# Patient Record
Sex: Female | Born: 1975 | Race: Black or African American | Hispanic: No | Marital: Married | Smoking: Never smoker
Health system: Southern US, Community
[De-identification: ages and names within clinical notes are randomized; demographics above are authoritative.]

## PROBLEM LIST (undated history)

## (undated) DIAGNOSIS — K219 Gastro-esophageal reflux disease without esophagitis: Secondary | ICD-10-CM

## (undated) DIAGNOSIS — K589 Irritable bowel syndrome without diarrhea: Secondary | ICD-10-CM

## (undated) DIAGNOSIS — N3010 Interstitial cystitis (chronic) without hematuria: Secondary | ICD-10-CM

## (undated) HISTORY — PX: LIPOMA EXCISION: SHX5283

---

## 2001-10-17 ENCOUNTER — Emergency Department (HOSPITAL_COMMUNITY): Admission: EM | Admit: 2001-10-17 | Discharge: 2001-10-17 | Payer: Self-pay | Admitting: Emergency Medicine

## 2001-12-11 ENCOUNTER — Encounter: Payer: Self-pay | Admitting: Chiropractor

## 2001-12-11 ENCOUNTER — Ambulatory Visit (HOSPITAL_COMMUNITY): Admission: RE | Admit: 2001-12-11 | Discharge: 2001-12-11 | Payer: Self-pay | Admitting: Chiropractor

## 2001-12-31 ENCOUNTER — Encounter: Payer: Self-pay | Admitting: Chiropractor

## 2001-12-31 ENCOUNTER — Ambulatory Visit (HOSPITAL_COMMUNITY): Admission: RE | Admit: 2001-12-31 | Discharge: 2001-12-31 | Payer: Self-pay | Admitting: Chiropractor

## 2002-02-22 ENCOUNTER — Encounter: Payer: Self-pay | Admitting: Physical Medicine and Rehabilitation

## 2002-02-22 ENCOUNTER — Ambulatory Visit (HOSPITAL_COMMUNITY): Admission: RE | Admit: 2002-02-22 | Discharge: 2002-02-22 | Payer: Self-pay | Admitting: Family Medicine

## 2002-03-06 ENCOUNTER — Encounter
Admission: RE | Admit: 2002-03-06 | Discharge: 2002-04-17 | Payer: Self-pay | Admitting: Physical Medicine and Rehabilitation

## 2002-07-01 ENCOUNTER — Encounter
Admission: RE | Admit: 2002-07-01 | Discharge: 2002-09-18 | Payer: Self-pay | Admitting: Physical Medicine and Rehabilitation

## 2003-06-12 ENCOUNTER — Ambulatory Visit (HOSPITAL_COMMUNITY): Admission: RE | Admit: 2003-06-12 | Discharge: 2003-06-12 | Payer: Self-pay | Admitting: *Deleted

## 2004-12-09 ENCOUNTER — Other Ambulatory Visit: Admission: RE | Admit: 2004-12-09 | Discharge: 2004-12-09 | Payer: Self-pay | Admitting: Obstetrics and Gynecology

## 2005-03-21 ENCOUNTER — Encounter: Admission: RE | Admit: 2005-03-21 | Discharge: 2005-03-21 | Payer: Self-pay | Admitting: Family Medicine

## 2005-03-24 ENCOUNTER — Encounter: Admission: RE | Admit: 2005-03-24 | Discharge: 2005-03-24 | Payer: Self-pay | Admitting: Family Medicine

## 2005-05-02 ENCOUNTER — Encounter: Admission: RE | Admit: 2005-05-02 | Discharge: 2005-05-02 | Payer: Self-pay | Admitting: Neurology

## 2005-06-08 ENCOUNTER — Encounter: Admission: RE | Admit: 2005-06-08 | Discharge: 2005-06-08 | Payer: Self-pay | Admitting: Neurology

## 2006-01-04 ENCOUNTER — Encounter (INDEPENDENT_AMBULATORY_CARE_PROVIDER_SITE_OTHER): Payer: Self-pay | Admitting: Specialist

## 2006-01-04 ENCOUNTER — Ambulatory Visit (HOSPITAL_BASED_OUTPATIENT_CLINIC_OR_DEPARTMENT_OTHER): Admission: RE | Admit: 2006-01-04 | Discharge: 2006-01-04 | Payer: Self-pay | Admitting: General Surgery

## 2007-04-10 ENCOUNTER — Other Ambulatory Visit: Admission: RE | Admit: 2007-04-10 | Discharge: 2007-04-10 | Payer: Self-pay | Admitting: Obstetrics and Gynecology

## 2007-05-24 ENCOUNTER — Ambulatory Visit (HOSPITAL_BASED_OUTPATIENT_CLINIC_OR_DEPARTMENT_OTHER): Admission: RE | Admit: 2007-05-24 | Discharge: 2007-05-24 | Payer: Self-pay | Admitting: Urology

## 2008-09-10 ENCOUNTER — Other Ambulatory Visit: Admission: RE | Admit: 2008-09-10 | Discharge: 2008-09-10 | Payer: Self-pay | Admitting: Obstetrics and Gynecology

## 2009-09-10 ENCOUNTER — Other Ambulatory Visit: Admission: RE | Admit: 2009-09-10 | Discharge: 2009-09-10 | Payer: Self-pay | Admitting: Obstetrics and Gynecology

## 2009-11-24 ENCOUNTER — Encounter: Admission: RE | Admit: 2009-11-24 | Discharge: 2009-11-24 | Payer: Self-pay | Admitting: Family Medicine

## 2010-07-20 NOTE — Op Note (Signed)
NAME:  Megan Wolf, Megan Wolf       ACCOUNT NO.:  [id number]   MEDICAL RECORD NO.:  [id number]          PATIENT TYPE:  AMB   LOCATION:  [facility]                         FACILITY:  [facility]   PHYSICIAN:  Martina Sinner, MD DATE OF BIRTH:  Oct 19, 1975   DATE OF PROCEDURE:  05/24/2007  DATE OF DISCHARGE:                               OPERATIVE REPORT   PREOPERATIVE DIAGNOSIS:  Pelvic pain.   POSTOPERATIVE DIAGNOSIS:  Pelvic pain, interstitial cystitis.   SURGERY:  Cystoscopy, bladder hydrodistention, bladder installation  therapy.   Megan Wolf has frequency and pelvic pressure.  She had urodynamics  and came with a diagnosis of IC.   The patient is prepped and draped in usual fashion.  A 21-French scope  was used for the examination.  Bladder mucosa and trigone were normal.  There was no stitch, foreign body or carcinoma.  She was hydrodistended  to  700 mL.  I emptied the bladder reexamined the bladder and she had  diffuse glomerulation with some oozing from the mucosa but no ulcers.  I  gently irrigated the bladder.  The bladder was intact.  The bladder was  then emptied and 15 mL of 0.5% Marcaine plus 400 mg of Pyridium was  instilled in the bladder neck.   Megan Wolf has interstitial cystitis and will be treated  accordingly.           ______________________________  Martina Sinner, MD  Electronically Signed     SAM/MEDQ  D:  05/24/2007  T:  05/24/2007  Job:  [id number]

## 2010-07-23 NOTE — Op Note (Signed)
NAME:  Megan Wolf, Megan Wolf       ACCOUNT NO.:  [id number]   MEDICAL RECORD NO.:  [id number]          PATIENT TYPE:  AMB   LOCATION:  [facility]                          FACILITY:  [facility]   PHYSICIAN:  Ollen Gross. Vernell Morgans, M.D. DATE OF BIRTH:  [birth year]   DATE OF PROCEDURE:  01/04/2006  DATE OF DISCHARGE:                               OPERATIVE REPORT   PREOPERATIVE DIAGNOSIS:  Large lipoma on the mid-back.   POSTOPERATIVE DIAGNOSIS:  Large lipoma on the mid-back.   PROCEDURES:  Excision of lipoma from the back.   SURGEON:  Ollen Gross. Carolynne Edouard, M.D.   ANESTHESIA:  General endotracheal.   PROCEDURE:  After informed consent was obtained, the patient was brought  to the operating room and left in the supine position on the stretcher.  After induction of general anesthesia, the patient was moved into a  prone position on the operating room table, and all pressure points were  padded.  The patient's back area were the lipoma was was then prepped  with Betadine and draped in usual sterile manner.   A transverse incision was made overlying the midportion of the lipoma.  The area was infiltrated with 0.25% Marcaine with epinephrine.  Once the  incision was carried into the subcutaneous tissue, it appeared that the  lipoma was not going to be easily shelled out.  It had lots of fingers  into the subcutaneous fat.  The lipoma was then dissected sharply away  from the rest of the subcutaneous tissue with the electrocautery.  The  lipoma came down to the tips of the spinous processes of the thoracic  vertebrae but did not go deep into the muscular area.  Once the complete  lipoma had been separated from the rest of the subcutaneous tissue, it  was removed and sent to pathology for further evaluation.  Hemostasis  was achieved using the Bovie electrocautery.  The deep layer of the  wound was then closed with running 3-0 Vicryl stitch, and the skin was  closed with a running 4-0 Monocryl subcuticular  stitch.  Benzoin, Steri-  Strips, and a bulky sterile dressing was then applied.   The patient tolerated well.  At the end of the case, all needle, sponge,  and instrument counts were correct.  The patient was then awakened and  taken to recovery room in stable condition.      Ollen Gross. Vernell Morgans, M.D.  Electronically Signed     PST/MEDQ  D:  01/05/2006  T:  01/05/2006  Job:  [id number]

## 2010-09-15 ENCOUNTER — Other Ambulatory Visit: Payer: Self-pay | Admitting: Obstetrics and Gynecology

## 2010-09-15 ENCOUNTER — Other Ambulatory Visit (HOSPITAL_COMMUNITY)
Admission: RE | Admit: 2010-09-15 | Discharge: 2010-09-15 | Disposition: A | Discharge status: Home / Self Care | Payer: BC Managed Care – PPO | Source: Ambulatory Visit | Attending: Obstetrics and Gynecology | Admitting: Obstetrics and Gynecology

## 2010-09-15 DIAGNOSIS — Z01419 Encounter for gynecological examination (general) (routine) without abnormal findings: Secondary | ICD-10-CM | POA: Insufficient documentation

## 2010-11-29 LAB — POCT HEMOGLOBIN-HEMACUE: Hemoglobin: 13.9

## 2011-12-14 ENCOUNTER — Other Ambulatory Visit (HOSPITAL_COMMUNITY)
Admission: RE | Admit: 2011-12-14 | Discharge: 2011-12-14 | Disposition: A | Discharge status: Home / Self Care | Payer: BC Managed Care – PPO | Source: Ambulatory Visit | Attending: Obstetrics and Gynecology | Admitting: Obstetrics and Gynecology

## 2011-12-14 ENCOUNTER — Other Ambulatory Visit: Payer: Self-pay | Admitting: Obstetrics and Gynecology

## 2011-12-14 DIAGNOSIS — Z01419 Encounter for gynecological examination (general) (routine) without abnormal findings: Secondary | ICD-10-CM | POA: Insufficient documentation

## 2012-02-23 ENCOUNTER — Other Ambulatory Visit: Payer: Self-pay | Admitting: Gastroenterology

## 2013-10-24 ENCOUNTER — Other Ambulatory Visit: Payer: Self-pay | Admitting: Obstetrics and Gynecology

## 2013-10-24 ENCOUNTER — Other Ambulatory Visit (HOSPITAL_COMMUNITY)
Admission: RE | Admit: 2013-10-24 | Discharge: 2013-10-24 | Disposition: A | Discharge status: Home / Self Care | Payer: BC Managed Care – PPO | Source: Ambulatory Visit | Attending: Obstetrics and Gynecology | Admitting: Obstetrics and Gynecology

## 2013-10-24 DIAGNOSIS — Z1151 Encounter for screening for human papillomavirus (HPV): Secondary | ICD-10-CM | POA: Insufficient documentation

## 2013-10-24 DIAGNOSIS — Z01419 Encounter for gynecological examination (general) (routine) without abnormal findings: Secondary | ICD-10-CM | POA: Diagnosis present

## 2013-10-25 LAB — CYTOLOGY - PAP

## 2014-11-21 ENCOUNTER — Other Ambulatory Visit: Payer: Self-pay | Admitting: Obstetrics and Gynecology

## 2014-11-21 ENCOUNTER — Other Ambulatory Visit (HOSPITAL_COMMUNITY)
Admission: RE | Admit: 2014-11-21 | Discharge: 2014-11-21 | Disposition: A | Discharge status: Home / Self Care | Payer: BLUE CROSS/BLUE SHIELD | Source: Ambulatory Visit | Attending: Obstetrics and Gynecology | Admitting: Obstetrics and Gynecology

## 2014-11-21 DIAGNOSIS — Z01419 Encounter for gynecological examination (general) (routine) without abnormal findings: Secondary | ICD-10-CM | POA: Insufficient documentation

## 2014-11-25 LAB — CYTOLOGY - PAP

## 2015-03-10 ENCOUNTER — Other Ambulatory Visit (HOSPITAL_COMMUNITY): Payer: Self-pay | Admitting: Obstetrics and Gynecology

## 2015-03-11 ENCOUNTER — Encounter (HOSPITAL_COMMUNITY): Payer: Self-pay

## 2015-03-11 ENCOUNTER — Encounter (HOSPITAL_COMMUNITY)
Admission: RE | Admit: 2015-03-11 | Discharge: 2015-03-11 | Disposition: A | Discharge status: Home / Self Care | Payer: BLUE CROSS/BLUE SHIELD | Source: Ambulatory Visit | Attending: Obstetrics and Gynecology | Admitting: Obstetrics and Gynecology

## 2015-03-11 DIAGNOSIS — Z01818 Encounter for other preprocedural examination: Secondary | ICD-10-CM | POA: Diagnosis not present

## 2015-03-11 DIAGNOSIS — R102 Pelvic and perineal pain: Secondary | ICD-10-CM | POA: Insufficient documentation

## 2015-03-11 DIAGNOSIS — D259 Leiomyoma of uterus, unspecified: Secondary | ICD-10-CM | POA: Diagnosis not present

## 2015-03-11 DIAGNOSIS — N920 Excessive and frequent menstruation with regular cycle: Secondary | ICD-10-CM | POA: Insufficient documentation

## 2015-03-11 HISTORY — DX: Irritable bowel syndrome, unspecified: K58.9

## 2015-03-11 HISTORY — DX: Gastro-esophageal reflux disease without esophagitis: K21.9

## 2015-03-11 HISTORY — DX: Interstitial cystitis (chronic) without hematuria: N30.10

## 2015-03-11 LAB — TYPE AND SCREEN
ABO/RH(D): B POS
Antibody Screen: NEGATIVE

## 2015-03-11 LAB — CBC
HCT: 37.1 % (ref 36.0–46.0)
Hemoglobin: 12.2 g/dL (ref 12.0–15.0)
MCH: 29.2 pg (ref 26.0–34.0)
MCHC: 32.9 g/dL (ref 30.0–36.0)
MCV: 88.8 fL (ref 78.0–100.0)
PLATELETS: 240 10*3/uL (ref 150–400)
RBC: 4.18 MIL/uL (ref 3.87–5.11)
RDW: 13.3 % (ref 11.5–15.5)
WBC: 3.5 10*3/uL — AB (ref 4.0–10.5)

## 2015-03-11 LAB — ABO/RH: ABO/RH(D): B POS

## 2015-03-11 NOTE — Patient Instructions (Addendum)
Your procedure is scheduled on:03/18/15  Enter through the Main Entrance at :7am Pick up desk phone and dial [phone] and inform us of your arrival.  Please call [phone] if you have any problems the morning of surgery.  Remember: Do not eat food or drink liquids, including water, after midnight:Tuesday   You may brush your teeth the morning of surgery.  Take these meds the morning of surgery with a sip of water:pain meds as needed  DO NOT wear jewelry, eye make-up, lipstick,body lotion, or dark fingernail polish.  (Polished toes are ok) You may wear deodorant.  If you are to be admitted after surgery, leave suitcase in car until your room has been assigned. Patients discharged on the day of surgery will not be allowed to drive home. Wear loose fitting, comfortable clothes for your ride home.

## 2015-03-17 ENCOUNTER — Other Ambulatory Visit (HOSPITAL_COMMUNITY): Payer: Self-pay | Admitting: Obstetrics and Gynecology

## 2015-03-17 NOTE — H&P (Signed)
Chief Complaint(s):   PreOp for 03/18/15   HPI:  General 40 y/o presents for history and physical exam in preparation for robotic assited laparoscopic hysterectomy with bilateral salpingectomy to manage fibroids and menorrhagia. she reports that her menses are very heavy . she has had hygiene accidents. she changes a tampon and pad every hour on her heaviest day . her cycle last 4 days. she has 2 heavy days. she denies intermenstrual bleeding. Her ultrasound performed. 12/03/2014 reveals 12.5 cm x 7.9 cm x 8.0 cm uterus. the endometrium is 5.5 cm. she has multiple uterine fibroids the largest is 3.6 cm. Her ovaries appeared normal bilaterally.  Current Medication:  Taking  MiraLax(Polyethylene Glycol 3350) Powder every couple of days     Tramadol HCl 50 MG Tablet 1 tablet as needed every 6 hrs     HydrOXYzine HCl 25 MG Tablet 1 tablet as needed every 8 hrs     Medication List reviewed and reconciled with the patient   Medical History:   interstitial cystitis, Dr. McDiarmid     history of Lipoma     IBS, Dr. Michail Sermon, s/p colonoscopy 02/2012     reflux   Allergies/Intolerance:   LATEX: Allergy - rash   Gyn History:   Sexual activity currently sexually active. Periods : regular. LMP 11/13/14. Birth control condoms. Last pap smear date 11/21/14. Denies Last mammogram date n/a. Denies Abnormal pap smear. H/O STD Chlamydia 1999. Menarche 12.   OB History:   Number of pregnancies 2. Pregnancy # 1 live birth, vaginal delivery. Pregnancy # 2 live birth, vaginal delivery. NVD 2.   Surgical History:   lipoma removed from back 2007   Hospitalization:   child birth x 2   Family History:   Father: alive 52 yrs, hypertension, high cholesterol     Mother: alive 25 yrs, hypertension, diagnosed with HTN    Brother 1: alive 55 yrs, A + W    Brother2: alive 46 yrs, A + W    Brother 3: alive 35 yrs, A + W    Sister 1: alive 27 yrs, A + W    Sister 2: alive 17 yrs, A + W    1 son(s) , 1  daughter(s) .    1st maternal cousin-ovarian cancer No family history of colon cancer or liver disease. Paternal cousin with colon polyps.  Social History:  General Tobacco use cigarettes: Never smoked, Tobacco history last updated 03/10/2015.  no EXPOSURE TO PASSIVE SMOKE.  Alcohol: yes, Rare.  no Caffeine.  no Recreational drug use.  no Exercise.  Marital Status: married.  Children: 1, girls, 1, Boys.  OCCUPATION: employed, works at [employer] .  Seat belt use: yes.  no Tobacco Exposure.  ROS: Negative except as stated in HPI.   Objective:  Vitals:  Wt 170, Wt change 3 lb, Pulse sitting 74, BP sitting 104/69  Past Results:  Examination:  General Examination alert, oriented, NAD" label="GENERAL APPEARANCE" categoryPropId="10089" examid="[id number]"GENERAL APPEARANCE alert, oriented, NAD.  McCoy,Tiffany 03/10/2015 02:19:25 PM &gt; , for female genital exam" label="CHAPERONE PRESENT" categoryPropId="21620" examid="[id number]"CHAPERONE PRESENT McCoy,Tiffany 03/10/2015 02:19:25 PM > , for female genital exam.  moist, warm" label="SKIN:" categoryPropId="10109" examid="[id number]"SKIN: moist, warm.  Conjunctiva clear" label="EYES:" categoryPropId="21468" examid="[id number]"EYES: Conjunctiva clear.  clear to auscultation bilaterally" label="LUNGS:" categoryPropId="87" examid="[id number]"LUNGS: clear to auscultation bilaterally.  regular rate and rhythm" label="HEART:" categoryPropId="86" examid="[id number]"HEART: regular rate and rhythm.  soft, non-tender/non-distended, bowel sounds present" label="ABDOMEN:" categoryPropId="88" examid="[id number]"ABDOMEN: soft, non-tender/non-distended, bowel sounds present.  normal external genitalia, labia - unremarkable, vagina -  pink moist mucosa, no lesions or abnormal discharge, cervix - no discharge or lesions or CMT, adnexa - no masses or tenderness, uterus - nontender 12 week size uterus " label="FEMALE GENITOURINARY:" categoryPropId="13414" examid="[id number]"FEMALE  GENITOURINARY: normal external genitalia, labia - unremarkable, vagina - pink moist mucosa, no lesions or abnormal discharge, cervix - no discharge or lesions or CMT, adnexa - no masses or tenderness, uterus - nontender 12 week size uterus .  no edema present" label="EXTREMITIES:" categoryPropId="89" examid="[id number]"EXTREMITIES: no edema present.  affect normal, good eye contact" label="PSYCH" categoryPropId="16316" examid="[id number]"PSYCH affect normal, good eye contact.  Physical Examination:   Assessment:  Assessment:  Fibroids - D25.9 (Primary)     Menorrhagia with regular cycle - N92.0     Plan:  Treatment:  Fibroids  Notes: options of management were discussed with the patient she is interested in definitive therapy via robotic assisted laparoscopic hysterectomy and bilateral salpingectomy. . d/w pt r/o surgery including but not limited to infection bleeding damage to bowel bladder ureters with the need for further surgery.. we discussed r/o conversion to abdominal hystectomy. we discused r/o blood transfusion... she voiced understanding and desires to proced with robotic assisted laparoscopic hysterectomy with bilateral salpingectomy.  Menorrhagia with regular cycle  Notes: options of management were discussed with the patient she is interested in definitive therapy via robotic assisted laparoscopic hysterectomy and bilateral salpingectomy. . d/w pt r/o surgery including but not limited to infection bleeding damage to bowel bladder ureters with the need for further surgery.. we discussed r/o conversion to abdominal hystectomy. we discused r/o blood transfusion... she voiced understanding and desires to proced with robotic assisted laparoscopic hysterectomy with bilateral salpingectomy.

## 2015-03-18 ENCOUNTER — Observation Stay (HOSPITAL_COMMUNITY)
Admission: RE | Admit: 2015-03-18 | Discharge: 2015-03-19 | Disposition: A | Discharge status: Home / Self Care | Payer: BLUE CROSS/BLUE SHIELD | Source: Ambulatory Visit | Attending: Obstetrics and Gynecology | Admitting: Obstetrics and Gynecology

## 2015-03-18 ENCOUNTER — Encounter (HOSPITAL_COMMUNITY): Payer: Self-pay | Admitting: Certified Registered Nurse Anesthetist

## 2015-03-18 ENCOUNTER — Ambulatory Visit (HOSPITAL_COMMUNITY): Payer: BLUE CROSS/BLUE SHIELD | Admitting: Anesthesiology

## 2015-03-18 ENCOUNTER — Encounter (HOSPITAL_COMMUNITY)
Admission: RE | Disposition: A | Discharge status: Still In Hospital | Payer: Self-pay | Source: Ambulatory Visit | Attending: Obstetrics and Gynecology

## 2015-03-18 DIAGNOSIS — D259 Leiomyoma of uterus, unspecified: Principal | ICD-10-CM | POA: Insufficient documentation

## 2015-03-18 DIAGNOSIS — K589 Irritable bowel syndrome without diarrhea: Secondary | ICD-10-CM | POA: Insufficient documentation

## 2015-03-18 DIAGNOSIS — N3010 Interstitial cystitis (chronic) without hematuria: Secondary | ICD-10-CM | POA: Insufficient documentation

## 2015-03-18 DIAGNOSIS — N920 Excessive and frequent menstruation with regular cycle: Secondary | ICD-10-CM | POA: Diagnosis not present

## 2015-03-18 DIAGNOSIS — R102 Pelvic and perineal pain: Secondary | ICD-10-CM | POA: Insufficient documentation

## 2015-03-18 DIAGNOSIS — Z90710 Acquired absence of both cervix and uterus: Secondary | ICD-10-CM | POA: Diagnosis present

## 2015-03-18 DIAGNOSIS — D219 Benign neoplasm of connective and other soft tissue, unspecified: Secondary | ICD-10-CM | POA: Diagnosis present

## 2015-03-18 HISTORY — PX: ROBOTIC ASSISTED TOTAL HYSTERECTOMY WITH SALPINGECTOMY: SHX6679

## 2015-03-18 HISTORY — PX: ROBOTIC ASSISTED TOTAL HYSTERECTOMY WITH BILATERAL SALPINGO OOPHERECTOMY: SHX6086

## 2015-03-18 LAB — PREGNANCY, URINE: PREG TEST UR: NEGATIVE

## 2015-03-18 SURGERY — ROBOTIC ASSISTED TOTAL HYSTERECTOMY WITH BILATERAL SALPINGO OOPHORECTOMY
Anesthesia: General | Site: Abdomen | Laterality: Bilateral

## 2015-03-18 MED ORDER — ONDANSETRON HCL 4 MG PO TABS: 4.0000 mg | ORAL_TABLET | Freq: Four times a day (QID) | ORAL | Status: DC | PRN

## 2015-03-18 MED ORDER — PROPOFOL 10 MG/ML IV BOLUS
INTRAVENOUS | Status: AC
  Filled 2015-03-18: qty 20

## 2015-03-18 MED ORDER — KETOROLAC TROMETHAMINE 30 MG/ML IJ SOLN
30.0000 mg | Freq: Four times a day (QID) | INTRAMUSCULAR | Status: DC
  Administered 2015-03-18 – 2015-03-19 (×3): 30 mg via INTRAVENOUS
  Filled 2015-03-18 (×3): qty 1

## 2015-03-18 MED ORDER — KETOROLAC TROMETHAMINE 30 MG/ML IJ SOLN: 30.0000 mg | Freq: Four times a day (QID) | INTRAMUSCULAR | Status: DC

## 2015-03-18 MED ORDER — LIDOCAINE HCL (PF) 1 % IJ SOLN
INTRAMUSCULAR | Status: AC
  Filled 2015-03-18: qty 5

## 2015-03-18 MED ORDER — HYDROMORPHONE HCL 1 MG/ML IJ SOLN: 0.2000 mg | INTRAMUSCULAR | Status: DC | PRN

## 2015-03-18 MED ORDER — ARTIFICIAL TEARS OP OINT
TOPICAL_OINTMENT | OPHTHALMIC | Status: AC
  Filled 2015-03-18: qty 3.5

## 2015-03-18 MED ORDER — DEXAMETHASONE SODIUM PHOSPHATE 10 MG/ML IJ SOLN
INTRAMUSCULAR | Status: DC | PRN
  Administered 2015-03-18: 4 mg via INTRAVENOUS

## 2015-03-18 MED ORDER — CEFAZOLIN SODIUM-DEXTROSE 2-3 GM-% IV SOLR
INTRAVENOUS | Status: AC
  Filled 2015-03-18: qty 50

## 2015-03-18 MED ORDER — PROPOFOL 10 MG/ML IV BOLUS
INTRAVENOUS | Status: DC | PRN
  Administered 2015-03-18: 200 mg via INTRAVENOUS

## 2015-03-18 MED ORDER — HYDROMORPHONE HCL 1 MG/ML IJ SOLN
0.2500 mg | INTRAMUSCULAR | Status: DC | PRN
  Administered 2015-03-18: 0.5 mg via INTRAVENOUS
  Administered 2015-03-18 (×2): 0.25 mg via INTRAVENOUS

## 2015-03-18 MED ORDER — KETOROLAC TROMETHAMINE 30 MG/ML IJ SOLN
INTRAMUSCULAR | Status: AC
  Filled 2015-03-18: qty 1

## 2015-03-18 MED ORDER — OXYCODONE HCL 5 MG/5ML PO SOLN: 5.0000 mg | Freq: Once | ORAL | Status: DC | PRN

## 2015-03-18 MED ORDER — ALUM & MAG HYDROXIDE-SIMETH 200-200-20 MG/5ML PO SUSP: 30.0000 mL | ORAL | Status: DC | PRN

## 2015-03-18 MED ORDER — ONDANSETRON HCL 4 MG/2ML IJ SOLN: 4.0000 mg | Freq: Four times a day (QID) | INTRAMUSCULAR | Status: DC | PRN

## 2015-03-18 MED ORDER — CEFAZOLIN SODIUM-DEXTROSE 2-3 GM-% IV SOLR
2.0000 g | INTRAVENOUS | Status: AC
Start: 2015-03-18 — End: 2015-03-18
  Administered 2015-03-18: 2 g via INTRAVENOUS

## 2015-03-18 MED ORDER — LIDOCAINE HCL (CARDIAC) 20 MG/ML IV SOLN
INTRAVENOUS | Status: DC | PRN
  Administered 2015-03-18: 50 mg via INTRAVENOUS

## 2015-03-18 MED ORDER — SCOPOLAMINE 1 MG/3DAYS TD PT72
1.0000 | MEDICATED_PATCH | Freq: Once | TRANSDERMAL | Status: DC
  Administered 2015-03-18: 1.5 mg via TRANSDERMAL

## 2015-03-18 MED ORDER — OXYCODONE HCL 5 MG PO TABS: 5.0000 mg | ORAL_TABLET | Freq: Once | ORAL | Status: DC | PRN

## 2015-03-18 MED ORDER — METHYLENE BLUE 1 % INJ SOLN
INTRAMUSCULAR | Status: AC
  Filled 2015-03-18: qty 1

## 2015-03-18 MED ORDER — SODIUM CHLORIDE 0.9 % IJ SOLN
INTRAMUSCULAR | Status: AC
  Filled 2015-03-18: qty 50

## 2015-03-18 MED ORDER — SCOPOLAMINE 1 MG/3DAYS TD PT72
MEDICATED_PATCH | TRANSDERMAL | Status: AC
  Administered 2015-03-18: 1.5 mg via TRANSDERMAL
  Filled 2015-03-18: qty 1

## 2015-03-18 MED ORDER — ROPIVACAINE HCL 5 MG/ML IJ SOLN
INTRAMUSCULAR | Status: AC
  Filled 2015-03-18: qty 60

## 2015-03-18 MED ORDER — NEOSTIGMINE METHYLSULFATE 10 MG/10ML IV SOLN
INTRAVENOUS | Status: DC | PRN
  Administered 2015-03-18: 3 mg via INTRAVENOUS

## 2015-03-18 MED ORDER — SODIUM CHLORIDE 0.9 % IV SOLN
INTRAVENOUS | Status: DC | PRN
  Administered 2015-03-18: 10 mL
  Administered 2015-03-18: 60 mL
  Administered 2015-03-18 (×4): 10 mL

## 2015-03-18 MED ORDER — GLYCOPYRROLATE 0.2 MG/ML IJ SOLN
INTRAMUSCULAR | Status: AC
  Filled 2015-03-18: qty 3

## 2015-03-18 MED ORDER — GLYCOPYRROLATE 0.2 MG/ML IJ SOLN
INTRAMUSCULAR | Status: DC | PRN
  Administered 2015-03-18: 0.4 mg via INTRAVENOUS

## 2015-03-18 MED ORDER — NEOSTIGMINE METHYLSULFATE 10 MG/10ML IV SOLN
INTRAVENOUS | Status: AC
  Filled 2015-03-18: qty 1

## 2015-03-18 MED ORDER — IBUPROFEN 800 MG PO TABS: 800.0000 mg | ORAL_TABLET | Freq: Three times a day (TID) | ORAL | Status: DC | PRN

## 2015-03-18 MED ORDER — FENTANYL CITRATE (PF) 100 MCG/2ML IJ SOLN
INTRAMUSCULAR | Status: DC | PRN
  Administered 2015-03-18: 25 ug via INTRAVENOUS
  Administered 2015-03-18: 50 ug via INTRAVENOUS
  Administered 2015-03-18: 100 ug via INTRAVENOUS
  Administered 2015-03-18: 25 ug via INTRAVENOUS
  Administered 2015-03-18: 50 ug via INTRAVENOUS

## 2015-03-18 MED ORDER — LACTATED RINGERS IV SOLN
INTRAVENOUS | Status: DC
Start: 2015-03-18 — End: 2015-03-19
  Administered 2015-03-18 – 2015-03-19 (×2): via INTRAVENOUS

## 2015-03-18 MED ORDER — FENTANYL CITRATE (PF) 250 MCG/5ML IJ SOLN
INTRAMUSCULAR | Status: AC
  Filled 2015-03-18: qty 5

## 2015-03-18 MED ORDER — MIDAZOLAM HCL 2 MG/2ML IJ SOLN
INTRAMUSCULAR | Status: AC
  Filled 2015-03-18: qty 2

## 2015-03-18 MED ORDER — MIDAZOLAM HCL 2 MG/2ML IJ SOLN
INTRAMUSCULAR | Status: DC | PRN
  Administered 2015-03-18: 2 mg via INTRAVENOUS

## 2015-03-18 MED ORDER — MENTHOL 3 MG MT LOZG
1.0000 | LOZENGE | OROMUCOSAL | Status: DC | PRN
  Administered 2015-03-18: 3 mg via ORAL
  Filled 2015-03-18: qty 9

## 2015-03-18 MED ORDER — LACTATED RINGERS IR SOLN
Status: DC | PRN
  Administered 2015-03-18: 3000 mL

## 2015-03-18 MED ORDER — DEXAMETHASONE SODIUM PHOSPHATE 4 MG/ML IJ SOLN
INTRAMUSCULAR | Status: AC
  Filled 2015-03-18: qty 1

## 2015-03-18 MED ORDER — SIMETHICONE 80 MG PO CHEW
80.0000 mg | CHEWABLE_TABLET | Freq: Four times a day (QID) | ORAL | Status: DC | PRN
  Administered 2015-03-19: 80 mg via ORAL
  Filled 2015-03-18: qty 1

## 2015-03-18 MED ORDER — SODIUM CHLORIDE 0.9 % IJ SOLN
INTRAMUSCULAR | Status: AC
  Filled 2015-03-18: qty 10

## 2015-03-18 MED ORDER — PANTOPRAZOLE SODIUM 40 MG PO TBEC
40.0000 mg | DELAYED_RELEASE_TABLET | Freq: Every day | ORAL | Status: DC
Start: 2015-03-18 — End: 2015-03-19
  Administered 2015-03-19: 40 mg via ORAL
  Filled 2015-03-18: qty 1

## 2015-03-18 MED ORDER — ONDANSETRON HCL 4 MG/2ML IJ SOLN
INTRAMUSCULAR | Status: DC | PRN
  Administered 2015-03-18: 4 mg via INTRAVENOUS

## 2015-03-18 MED ORDER — ROCURONIUM BROMIDE 100 MG/10ML IV SOLN
INTRAVENOUS | Status: DC | PRN
  Administered 2015-03-18: 20 mg via INTRAVENOUS
  Administered 2015-03-18: 50 mg via INTRAVENOUS
  Administered 2015-03-18: 10 mg via INTRAVENOUS

## 2015-03-18 MED ORDER — ONDANSETRON HCL 4 MG/2ML IJ SOLN
INTRAMUSCULAR | Status: AC
  Filled 2015-03-18: qty 2

## 2015-03-18 MED ORDER — LACTATED RINGERS IV SOLN
INTRAVENOUS | Status: DC
  Administered 2015-03-18 (×2): via INTRAVENOUS

## 2015-03-18 MED ORDER — OXYCODONE-ACETAMINOPHEN 5-325 MG PO TABS
1.0000 | ORAL_TABLET | ORAL | Status: DC | PRN
  Administered 2015-03-18 – 2015-03-19 (×2): 1 via ORAL
  Filled 2015-03-18 (×2): qty 1

## 2015-03-18 MED ORDER — HYDROMORPHONE HCL 1 MG/ML IJ SOLN
INTRAMUSCULAR | Status: AC
  Filled 2015-03-18: qty 1

## 2015-03-18 MED ORDER — SENNA 8.6 MG PO TABS
1.0000 | ORAL_TABLET | Freq: Two times a day (BID) | ORAL | Status: DC
  Administered 2015-03-18 – 2015-03-19 (×2): 8.6 mg via ORAL
  Filled 2015-03-18 (×5): qty 1

## 2015-03-18 MED ORDER — KETOROLAC TROMETHAMINE 30 MG/ML IJ SOLN
INTRAMUSCULAR | Status: DC | PRN
  Administered 2015-03-18: 30 mg via INTRAVENOUS

## 2015-03-18 MED ORDER — STERILE WATER FOR IRRIGATION IR SOLN
Status: DC | PRN
  Administered 2015-03-18: 400 mL via INTRAVESICAL

## 2015-03-18 SURGICAL SUPPLY — 54 items
BARRIER ADHS 3X4 INTERCEED (GAUZE/BANDAGES/DRESSINGS) ×3 IMPLANT
BENZOIN TINCTURE PRP APPL 2/3 (GAUZE/BANDAGES/DRESSINGS) ×3 IMPLANT
CATH FOLEY 3WAY  5CC 16FR (CATHETERS) ×2
CATH FOLEY 3WAY 5CC 16FR (CATHETERS) ×1 IMPLANT
CONT PATH 16OZ SNAP LID 3702 (MISCELLANEOUS) ×3 IMPLANT
COVER BACK TABLE 60X90IN (DRAPES) ×6 IMPLANT
COVER TIP SHEARS 8 DVNC (MISCELLANEOUS) ×1 IMPLANT
COVER TIP SHEARS 8MM DA VINCI (MISCELLANEOUS) ×2
DECANTER SPIKE VIAL GLASS SM (MISCELLANEOUS) ×3 IMPLANT
DEFOGGER SCOPE WARMER CLEARIFY (MISCELLANEOUS) ×3 IMPLANT
DILATOR CANAL MILEX (MISCELLANEOUS) ×3 IMPLANT
DRSG COVADERM PLUS 2X2 (GAUZE/BANDAGES/DRESSINGS) ×12 IMPLANT
DRSG OPSITE POSTOP 3X4 (GAUZE/BANDAGES/DRESSINGS) ×3 IMPLANT
DURAPREP 26ML APPLICATOR (WOUND CARE) ×3 IMPLANT
ELECT REM PT RETURN 9FT ADLT (ELECTROSURGICAL) ×3
ELECTRODE REM PT RTRN 9FT ADLT (ELECTROSURGICAL) ×1 IMPLANT
GAUZE VASELINE 3X9 (GAUZE/BANDAGES/DRESSINGS) IMPLANT
GLOVE BIOGEL M 6.5 STRL (GLOVE) ×9 IMPLANT
GLOVE BIOGEL PI IND STRL 6.5 (GLOVE) ×1 IMPLANT
GLOVE BIOGEL PI IND STRL 7.0 (GLOVE) ×5 IMPLANT
GLOVE BIOGEL PI INDICATOR 6.5 (GLOVE) ×2
GLOVE BIOGEL PI INDICATOR 7.0 (GLOVE) ×10
KIT ACCESSORY DA VINCI DISP (KITS) ×2
KIT ACCESSORY DVNC DISP (KITS) ×1 IMPLANT
LEGGING LITHOTOMY PAIR STRL (DRAPES) ×3 IMPLANT
LIQUID BAND (GAUZE/BANDAGES/DRESSINGS) ×3 IMPLANT
OCCLUDER COLPOPNEUMO (BALLOONS) ×6 IMPLANT
PACK ROBOT WH (CUSTOM PROCEDURE TRAY) ×3 IMPLANT
PACK ROBOTIC GOWN (GOWN DISPOSABLE) ×3 IMPLANT
PAD PREP 24X48 CUFFED NSTRL (MISCELLANEOUS) ×6 IMPLANT
PAD TRENDELENBURG POSITION (MISCELLANEOUS) ×3 IMPLANT
SET CYSTO W/LG BORE CLAMP LF (SET/KITS/TRAYS/PACK) ×3 IMPLANT
SET IRRIG TUBING LAPAROSCOPIC (IRRIGATION / IRRIGATOR) ×3 IMPLANT
SET TRI-LUMEN FLTR TB AIRSEAL (TUBING) IMPLANT
SUT VIC AB 0 CT1 27 (SUTURE) ×4
SUT VIC AB 0 CT1 27XBRD ANBCTR (SUTURE) ×2 IMPLANT
SUT VIC AB 0 CT1 27XBRD ANTBC (SUTURE) IMPLANT
SUT VICRYL 0 27 CT2 27 ABS (SUTURE) ×15 IMPLANT
SUT VICRYL 0 UR6 27IN ABS (SUTURE) ×3 IMPLANT
SUT VICRYL RAPIDE 4/0 PS 2 (SUTURE) ×6 IMPLANT
SUT VLOC 180 0 9IN  GS21 (SUTURE) ×4
SUT VLOC 180 0 9IN GS21 (SUTURE) ×2 IMPLANT
SYR 50ML LL SCALE MARK (SYRINGE) ×6 IMPLANT
TIP RUMI ORANGE 6.7MMX12CM (TIP) ×3 IMPLANT
TIP UTERINE 5.1X6CM LAV DISP (MISCELLANEOUS) IMPLANT
TIP UTERINE 6.7X10CM GRN DISP (MISCELLANEOUS) IMPLANT
TIP UTERINE 6.7X6CM WHT DISP (MISCELLANEOUS) IMPLANT
TIP UTERINE 6.7X8CM BLUE DISP (MISCELLANEOUS) IMPLANT
TOWEL OR 17X24 6PK STRL BLUE (TOWEL DISPOSABLE) ×9 IMPLANT
TROCAR 12M 150ML BLUNT (TROCAR) ×3 IMPLANT
TROCAR DISP BLADELESS 8 DVNC (TROCAR) ×1 IMPLANT
TROCAR DISP BLADELESS 8MM (TROCAR) ×2
TROCAR PORT AIRSEAL 8X120 (TROCAR) ×3 IMPLANT
WATER STERILE IRR 1000ML POUR (IV SOLUTION) ×9 IMPLANT

## 2015-03-18 NOTE — H&P (Signed)
Date of Initial H&P: 03/17/2015 History reviewed, patient examined, no change in status, stable for surgery.

## 2015-03-18 NOTE — Transfer of Care (Signed)
Immediate Anesthesia Transfer of Care Note  Patient: Megan Wolf  Procedure(s) Performed: Procedure(s): ROBOTIC ASSISTED TOTAL HYSTERECTOMY WITH BILATERAL SALPINGO OOPHORECTOMY (Bilateral)  Patient Location: PACU  Anesthesia Type:General  Level of Consciousness: awake, alert  and oriented  Airway & Oxygen Therapy: Patient Spontanous Breathing and Patient connected to nasal cannula oxygen  Post-op Assessment: Report given to RN and Post -op Vital signs reviewed and stable  Post vital signs: Reviewed and stable  Last Vitals:  Filed Vitals:   03/18/15 0710  BP: 125/84  Pulse: 91  Temp: 36.7 C  Resp: 18    Complications: No apparent anesthesia complications

## 2015-03-18 NOTE — Anesthesia Postprocedure Evaluation (Signed)
Anesthesia Post Note  Patient: Megan Wolf  Procedure(s) Performed: Procedure(s) (LRB): ROBOTIC ASSISTED TOTAL HYSTERECTOMY WITH BILATERAL SALPINGO OOPHORECTOMY (Bilateral)  Patient location during evaluation: [facility] Anesthesia Type: General Level of consciousness: awake and alert Pain management: satisfactory to patient Vital Signs Assessment: post-procedure vital signs reviewed and stable Respiratory status: spontaneous breathing and respiratory function stable Cardiovascular status: stable Postop Assessment: adequate PO intake Anesthetic complications: no    Last Vitals:  Filed Vitals:   03/18/15 1400 03/18/15 1505  BP: 121/59 118/64  Pulse: 88 81  Temp: 36.4 C 36.7 C  Resp: 16 16    Last Pain:  Filed Vitals:   03/18/15 1643  PainSc: 2                  Antoni Stefan

## 2015-03-18 NOTE — Anesthesia Postprocedure Evaluation (Signed)
Anesthesia Post Note  Patient: Megan Wolf  Procedure(s) Performed: Procedure(s) (LRB): ROBOTIC ASSISTED TOTAL HYSTERECTOMY WITH BILATERAL SALPINGO OOPHORECTOMY (Bilateral)  Patient location during evaluation: PACU Anesthesia Type: General Level of consciousness: awake and alert and patient cooperative Pain management: pain level controlled Vital Signs Assessment: post-procedure vital signs reviewed and stable Respiratory status: spontaneous breathing and respiratory function stable Cardiovascular status: stable Anesthetic complications: no    Last Vitals:  Filed Vitals:   03/18/15 1245 03/18/15 1300  BP: 112/76   Pulse: 82   Temp: 36.7 C   Resp: 14 16    Last Pain:  Filed Vitals:   03/18/15 1304  PainSc: 2                  Jabreel Chimento S

## 2015-03-18 NOTE — Anesthesia Procedure Notes (Signed)
Procedure Name: Intubation Date/Time: 03/18/2015 8:33 AM Performed by: Bufford Spikes Pre-anesthesia Checklist: Patient identified, Timeout performed, Emergency Drugs available, Suction available and Patient being monitored Patient Re-evaluated:Patient Re-evaluated prior to inductionOxygen Delivery Method: Circle system utilized Preoxygenation: Pre-oxygenation with 100% oxygen Intubation Type: IV induction Ventilation: Mask ventilation without difficulty Laryngoscope Size: Miller and 2 Grade View: Grade I Tube type: Oral Tube size: 7.0 mm Number of attempts: 1 Airway Equipment and Method: Stylet Placement Confirmation: ETT inserted through vocal cords under direct vision,  breath sounds checked- equal and bilateral,  positive ETCO2 and CO2 detector Secured at: 21 cm Tube secured with: Tape Dental Injury: Teeth and Oropharynx as per pre-operative assessment

## 2015-03-18 NOTE — Op Note (Signed)
03/18/2015  3:18 PM  PATIENT:  Megan Wolf  40 y.o. female  PRE-OPERATIVE DIAGNOSIS:  D25.9 Fibroids  N92.0 Menorrhagia  R10.2 Pelvic Pain  POST-OPERATIVE DIAGNOSIS:  fibroids, menorrhagia, pelvic pain  PROCEDURE:  Robotic assisted hysterectomy with bilateral salpingectomy  SURGEON:  Surgeon(s) and Role:    * Christophe Louis, MD - Primary    * Thurnell Lose, MD - Assisting  PHYSICIAN ASSISTANT: None  ASSISTANTS: Dr. Thurnell Lose was need to assist due to the complexity of the surgery.    ANESTHESIA:   general  EBL:  Total I/O In: 1300 [I.V.:1300] Out: 575 [Urine:550; Blood:25]  BLOOD ADMINISTERED:none  DRAINS: Urinary Catheter (Foley)   LOCAL MEDICATIONS USED:  OTHER ropivicaine  SPECIMEN:  Source of Specimen:  uterus cervix and bilateral tubes   DISPOSITION OF SPECIMEN:  PATHOLOGY  COUNTS:  YES  TOURNIQUET:  * No tourniquets in log *  DICTATION: .Dragon Dictation  PLAN OF CARE: Admit for overnight observation  PATIENT DISPOSITION:  PACU - hemodynamically stable.   Delay start of Pharmacological VTE agent (>24hrs) due to surgical blood loss or risk of bleeding: not applicable  Procedure: The  patient was taken to the operating room where she was placed under general anesthesia. She was placed in dorsal lithotomy position and prepped and draped in the usual sterile fashion. A weighted speculum was placed into the vagina.  A Deaver was placed anteriorly for retraction. The anterior lip of the cervix was grasped with a single-tooth tenaculum. The vaginal mucosa was injected with 2.5 cc of ropivacaine at the 2/4/ 8 and 10 oclock  positions. The uterus was sounded to 12 cm the cervix was dilated to 6 mm . 0 vicryl sutrure  placed at the 12 and 6:00 positions  Of the cervix to facilitate placement of a Rumi  uterine  manipulator. The manipulator was placed without difficulty. Weighted speculum and Deaver were removed .  Attention was turned to the patient's abdomen  where a  12 mm skin incision was made 3 cm above the umbilicus. the abdomen was filled with CO2 gas.  The laparoscope was removed. 60 cc of ropivacaine were injected into the abdominal cavity. The laparoscope  was reinserted. An  65mm incision was made in the right upper quadrant and an 8 mm   trocar was placed 15 centimeters from the umbilicus.later connected to robotic arm #1). An 8MM incision was made in the  left upper quadrant TROCAR WAS PLACED 16 cm from the umbilicus. Later connected to robotic arm #2.  Attention was turned to the left upper quadrant where a 11 mm midclavicular assistant  trocar was placed. ( All incision sites were injected with 10cc of ropivicaine prior to port placement. )  Once all ports had been placed under direct visualization.The laparoscope was removed and the [employer] robotic system was then right-side docked.  The robotic  arms were connected to the corresponding trocars as listed above. The laparoscope  was then reinserted.  The PK bipolar cautery was placed into port #1. The monopolar scissor placed in the port #2. All instruments were directed into the pelvis under direct visualization.  Attention  was turned to the surgeons console.. The left mesosalpinx and left  utero-ovarian ligament was cauterized with PK and excised with scissors. The broad ligament was cauterized with PK incised with scissors. The round ligament was cauterized with the PK incised with scissors. The anterior leaf of broad ligament was incised along the bladder reflection to the midline.  The right mesosalpinx and right  utero-ovarian ligament was cauterized with PK and excised with scissors. The right broad ligament was cauterized with PK excised scissors. The right round ligament was cauterized with PK and excised with scissors. The   broad ligament was incised  to the midline. The bladder was filled with sterile water stained with methylene blue.  The bladder was dissected off the lower uterine  segments of the cervix via sharp and blunt dissection.  The uterine arteries were skeletonized bilaterally. They were cauterized with PK and transected. The KOH ring  was identified.  The anterior colpotomy was performed followed by the posterior colpotomy. Once the uterus and cervix were completely excised They were removed through the vagina.   The pk and scissors were removed and log tip forceps were   placed in the port #1 and the cutting needle driver was placed in to port #2. The vaginal cuff  Was closed with 0 v-lock suture.  The pelvis was irrigated. Marland Kitchen.Excellent hemostasis was noted. All pelvic pedicles were examined and hemostasis was noted. Interseed was placed along the vaginal cuff. All instuments  removed from the ports.  All ports were removed under direct  Visualization. The  pneumoperitoneum was released.  The fascia of the 12 mm umbilical port was closed with 0 Vicryl. The skin incisions were closed with 4-0 Vicryl and then covered with Dermabond.     Sponge lap and needle counts were correct x2.  The patient was awakened from anesthesia and taken to the recovery room in stable condition.

## 2015-03-18 NOTE — Addendum Note (Signed)
Addendum  created 03/18/15 1708 by Flossie Dibble, CRNA   Modules edited: Notes Section   Notes Section:  File: [id number]

## 2015-03-18 NOTE — Anesthesia Preprocedure Evaluation (Addendum)
Anesthesia Evaluation  Patient identified by MRN, date of birth, ID band Patient awake    Reviewed: Allergy & Precautions, NPO status , Patient's Chart, lab work & pertinent test results  Airway Mallampati: II   Neck ROM: Full    Dental  (+) Teeth Intact, Dental Advisory Given   Pulmonary neg pulmonary ROS,    breath sounds clear to auscultation       Cardiovascular negative cardio ROS   Rhythm:Regular     Neuro/Psych negative neurological ROS  negative psych ROS   GI/Hepatic negative GI ROS, Neg liver ROS, GERD  Medicated,  Endo/Other  negative endocrine ROS  Renal/GU negative Renal ROS  negative genitourinary   Musculoskeletal negative musculoskeletal ROS (+)   Abdominal (+)  Abdomen: soft.    Peds negative pediatric ROS (+)  Hematology negative hematology ROS (+)   Anesthesia Other Findings   Reproductive/Obstetrics negative OB ROS                             Anesthesia Physical Anesthesia Plan  ASA: I  Anesthesia Plan: General   Post-op Pain Management:    Induction: Intravenous  Airway Management Planned: Oral ETT  Additional Equipment:   Intra-op Plan:   Post-operative Plan: Extubation in OR  Informed Consent: I have reviewed the patients History and Physical, chart, labs and discussed the procedure including the risks, benefits and alternatives for the proposed anesthesia with the patient or authorized representative who has indicated his/her understanding and acceptance.     Plan Discussed with:   Anesthesia Plan Comments:         Anesthesia Quick Evaluation

## 2015-03-19 ENCOUNTER — Encounter (HOSPITAL_COMMUNITY): Payer: Self-pay | Admitting: Obstetrics and Gynecology

## 2015-03-19 DIAGNOSIS — D259 Leiomyoma of uterus, unspecified: Secondary | ICD-10-CM | POA: Diagnosis not present

## 2015-03-19 LAB — CBC
HEMATOCRIT: 30.6 % — AB (ref 36.0–46.0)
Hemoglobin: 10 g/dL — ABNORMAL LOW (ref 12.0–15.0)
MCH: 28.8 pg (ref 26.0–34.0)
MCHC: 32.7 g/dL (ref 30.0–36.0)
MCV: 88.2 fL (ref 78.0–100.0)
PLATELETS: 205 10*3/uL (ref 150–400)
RBC: 3.47 MIL/uL — ABNORMAL LOW (ref 3.87–5.11)
RDW: 13.5 % (ref 11.5–15.5)
WBC: 9.6 10*3/uL (ref 4.0–10.5)

## 2015-03-19 LAB — GLUCOSE, CAPILLARY: GLUCOSE-CAPILLARY: 120 mg/dL — AB (ref 65–99)

## 2015-03-19 MED ORDER — OXYCODONE-ACETAMINOPHEN 5-325 MG PO TABS: 1.0000 | ORAL_TABLET | Freq: Four times a day (QID) | ORAL | Status: DC | PRN

## 2015-03-19 MED ORDER — IBUPROFEN 800 MG PO TABS: 800.0000 mg | ORAL_TABLET | Freq: Three times a day (TID) | ORAL | Status: AC | PRN

## 2015-03-19 NOTE — Discharge Instructions (Signed)
Laparoscopic hysterectomy

## 2015-03-19 NOTE — Discharge Summary (Signed)
Physician Discharge Summary  Patient ID: Megan Wolf MRN: [id number] DOB/AGE: 07/20/1975 40 y.o.  Admit date: 03/18/2015 Discharge date: 03/19/2015  Admission Diagnoses:1 . Fibroids 2 menorrhagia 3 pelvic pain 4. Interstitial cystitis 5. Irritable bowel syndrome   Discharge Diagnoses:  Active Problems:   Fibroids   Menorrhagia   S/P hysterectomy   Discharged Condition: good  Hospital Course: pt underwent a robotic assisted laparoscopic hysterectomy on 03/18/2015. She did well postoperatively with return of bowel and bladder function.  Consults: None  Significant Diagnostic Studies: labs: hgb pod #1  10  Treatments: surgery: robotic assisted laparoscopic hysterectomy with biltaral salpingectomy   Discharge Exam: Blood pressure 100/56, pulse 92, temperature 98.2 F (36.8 C), temperature source Oral, resp. rate 18, height 5' 5.5" (1.664 m), weight 169 lb (76.658 kg), last menstrual period 02/23/2015, SpO2 100 %. General appearance: alert and cooperative Cardio: regular rate and rhythm, S1, S2 normal, no murmur, click, rub or gallop GI: soft appropriately tender nondistended  + BS  Extremities: extremities normal, atraumatic, no cyanosis or edema   incisions clean dry and intact   Disposition: Final discharge disposition not confirmed  Discharge Instructions    Call MD for:  persistant nausea and vomiting    Complete by:  As directed      Call MD for:  redness, tenderness, or signs of infection (pain, swelling, redness, odor or green/yellow discharge around incision site)    Complete by:  As directed      Call MD for:  severe uncontrolled pain    Complete by:  As directed      Call MD for:  temperature >100.4    Complete by:  As directed      Diet - low sodium heart healthy    Complete by:  As directed      Driving Restrictions    Complete by:  As directed   Avoid driving for 1 week     Increase activity slowly    Complete by:  As directed      Lifting  restrictions    Complete by:  As directed   Avoid lifting over 10 lbs     Sexual Activity Restrictions    Complete by:  As directed   Avoid sex for 6 weeks            Medication List    STOP taking these medications        traMADol 50 MG tablet  Commonly known as:  ULTRAM      TAKE these medications        acetaminophen 500 MG tablet  Commonly known as:  TYLENOL  Take 500 mg by mouth every 6 (six) hours as needed for moderate pain.     hydrOXYzine 25 MG tablet  Commonly known as:  ATARAX/VISTARIL  Take 25-50 mg by mouth at bedtime.     ibuprofen 800 MG tablet  Commonly known as:  ADVIL,MOTRIN  Take 1 tablet (800 mg total) by mouth every 8 (eight) hours as needed for moderate pain (mild pain).  Start taking on:  03/22/2015     multivitamin with minerals Tabs tablet  Take 1 tablet by mouth daily.     oxyCODONE-acetaminophen 5-325 MG tablet  Commonly known as:  PERCOCET/ROXICET  Take 1-2 tablets by mouth every 6 (six) hours as needed (moderate to severe pain (when tolerating fluids)).     polyethylene glycol packet  Commonly known as:  MIRALAX / GLYCOLAX  Take 17 g by mouth daily.  Follow-up Information    Follow up with Catha Brow., MD In 2 weeks.   Specialty:  Obstetrics and Gynecology   Why:  pt already has an appointment    Contact information:   [address] [place] [address] [phone]       Signed: Catha Brow. 03/19/2015, 9:30 AM

## 2015-03-19 NOTE — Progress Notes (Signed)
Pt   Has ambulated out  Teaching complete

## 2015-03-19 NOTE — Progress Notes (Signed)
pts teaching complete  Ambulated out  With husband

## 2015-03-23 ENCOUNTER — Emergency Department (HOSPITAL_COMMUNITY): Payer: BLUE CROSS/BLUE SHIELD

## 2015-03-23 ENCOUNTER — Encounter (HOSPITAL_COMMUNITY): Payer: Self-pay | Admitting: Family Medicine

## 2015-03-23 ENCOUNTER — Emergency Department (HOSPITAL_COMMUNITY)
Admission: EM | Admit: 2015-03-23 | Discharge: 2015-03-23 | Disposition: A | Discharge status: Home / Self Care | Payer: BLUE CROSS/BLUE SHIELD | Attending: Emergency Medicine | Admitting: Emergency Medicine

## 2015-03-23 DIAGNOSIS — Z91040 Latex allergy status: Secondary | ICD-10-CM | POA: Diagnosis not present

## 2015-03-23 DIAGNOSIS — R0600 Dyspnea, unspecified: Secondary | ICD-10-CM

## 2015-03-23 DIAGNOSIS — R079 Chest pain, unspecified: Secondary | ICD-10-CM | POA: Insufficient documentation

## 2015-03-23 DIAGNOSIS — R1084 Generalized abdominal pain: Secondary | ICD-10-CM | POA: Insufficient documentation

## 2015-03-23 DIAGNOSIS — Z8719 Personal history of other diseases of the digestive system: Secondary | ICD-10-CM | POA: Insufficient documentation

## 2015-03-23 DIAGNOSIS — Z79899 Other long term (current) drug therapy: Secondary | ICD-10-CM | POA: Insufficient documentation

## 2015-03-23 DIAGNOSIS — R0602 Shortness of breath: Secondary | ICD-10-CM | POA: Diagnosis present

## 2015-03-23 LAB — BASIC METABOLIC PANEL
Anion gap: 10 (ref 5–15)
BUN: 7 mg/dL (ref 6–20)
CO2: 27 mmol/L (ref 22–32)
Calcium: 9.5 mg/dL (ref 8.9–10.3)
Chloride: 104 mmol/L (ref 101–111)
Creatinine, Ser: 0.93 mg/dL (ref 0.44–1.00)
GFR calc Af Amer: >60 mL/min (ref >60)
GFR calc non Af Amer: >60 mL/min (ref >60)
Glucose, Bld: 99 mg/dL (ref 65–99)
Potassium: 4 mmol/L (ref 3.5–5.1)
Sodium: 141 mmol/L (ref 135–145)

## 2015-03-23 LAB — CBC
HCT: 36.7 % (ref 36.0–46.0)
Hemoglobin: 12.1 g/dL (ref 12.0–15.0)
MCH: 29.3 pg (ref 26.0–34.0)
MCHC: 33 g/dL (ref 30.0–36.0)
MCV: 88.9 fL (ref 78.0–100.0)
Platelets: 292 10*3/uL (ref 150–400)
RBC: 4.13 MIL/uL (ref 3.87–5.11)
RDW: 12.8 % (ref 11.5–15.5)
WBC: 4.3 10*3/uL (ref 4.0–10.5)

## 2015-03-23 LAB — I-STAT TROPONIN, ED
TROPONIN I, POC: 0 ng/mL (ref 0.00–0.08)
Troponin i, poc: 0 ng/mL (ref 0.00–0.08)

## 2015-03-23 LAB — D-DIMER, QUANTITATIVE: D-Dimer, Quant: 2.55 ug/mL-FEU — ABNORMAL HIGH (ref 0.00–0.50)

## 2015-03-23 MED ORDER — ALBUTEROL SULFATE HFA 108 (90 BASE) MCG/ACT IN AERS
1.0000 | INHALATION_SPRAY | Freq: Once | RESPIRATORY_TRACT | Status: AC
  Administered 2015-03-23: 1 via RESPIRATORY_TRACT
  Filled 2015-03-23: qty 6.7

## 2015-03-23 MED ORDER — IOHEXOL 350 MG/ML SOLN
100.0000 mL | Freq: Once | INTRAVENOUS | Status: AC | PRN
  Administered 2015-03-23: 100 mL via INTRAVENOUS

## 2015-03-23 NOTE — ED Notes (Signed)
Pt ambulated to restroom. 

## 2015-03-23 NOTE — ED Notes (Signed)
Pt here for SOB since her surgery. sts hard to get a deep breathe and pain with breathing. Sent here by her doctor to R/O PE.

## 2015-03-23 NOTE — ED Notes (Signed)
Pt ambulated to restroom. Pt stated she felt short of breath while ambulating to restroom.

## 2015-03-23 NOTE — ED Notes (Signed)
Ambulated pt in hallway. Pt's O2 started at 100%, while sitting on the side of the bed pt's O2 went down to 95%. While ambulating in hallway pt's O2 dropped to 91% and picked back up to 100% once on our way back to room.

## 2015-03-23 NOTE — Discharge Instructions (Signed)
Shortness of Breath Shortness of breath means you have trouble breathing. It could also mean that you have a medical problem. You should get immediate medical care for shortness of breath. CAUSES   Not enough oxygen in the air such as with high altitudes or a smoke-filled room.  Certain lung diseases, infections, or problems.  Heart disease or conditions, such as angina or heart failure.  Low red blood cells (anemia).  Poor physical fitness, which can cause shortness of breath when you exercise.  Chest or back injuries or stiffness.  Being overweight.  Smoking.  Anxiety, which can make you feel like you are not getting enough air. DIAGNOSIS  Serious medical problems can often be found during your physical exam. Tests may also be done to determine why you are having shortness of breath. Tests may include:  Chest X-rays.  Lung function tests.  Blood tests.  An electrocardiogram (ECG).  An ambulatory electrocardiogram. An ambulatory ECG records your heartbeat patterns over a 24-hour period.  Exercise testing.  A transthoracic echocardiogram (TTE). During echocardiography, sound waves are used to evaluate how blood flows through your heart.  A transesophageal echocardiogram (TEE).  Imaging scans. Your health care provider may not be able to find a cause for your shortness of breath after your exam. In this case, it is important to have a follow-up exam with your health care provider as directed.  TREATMENT  Treatment for shortness of breath depends on the cause of your symptoms and can vary greatly. HOME CARE INSTRUCTIONS   Do not smoke. Smoking is a common cause of shortness of breath. If you smoke, ask for help to quit.  Avoid being around chemicals or things that may bother your breathing, such as paint fumes and dust.  Rest as needed. Slowly resume your usual activities.  If medicines were prescribed, take them as directed for the full length of time directed. This  includes oxygen and any inhaled medicines.  Keep all follow-up appointments as directed by your health care provider. SEEK MEDICAL CARE IF:   Your condition does not improve in the time expected.  You have a hard time doing your normal activities even with rest.  You have any new symptoms. SEEK IMMEDIATE MEDICAL CARE IF:   Your shortness of breath gets worse.  You feel light-headed, faint, or develop a cough not controlled with medicines.  You start coughing up blood.  You have pain with breathing.  You have chest pain or pain in your arms, shoulders, or abdomen.  You have a fever.  You are unable to walk up stairs or exercise the way you normally do. MAKE SURE YOU:  Understand these instructions.  Will watch your condition.  Will get help right away if you are not doing well or get worse.   This information is not intended to replace advice given to you by your health care provider. Make sure you discuss any questions you have with your health care provider.  Follow up with cardiology for re-evaluation. You make require echocardiogram for further evaluation. Follow up with OBGYN surgeon as indicated. Return to the ED if you have severe worsening of your symptoms, dizziness, loss of consciousness, chest pain, fever, lower extremity swelling.

## 2015-03-23 NOTE — ED Notes (Signed)
Pt in X-Ray ?

## 2015-03-23 NOTE — ED Provider Notes (Signed)
CSN: [id number]     Arrival date & time 03/23/15  0940 History   First MD Initiated Contact with Patient 03/23/15 1012     Chief Complaint  Patient presents with  . Shortness of Breath     (Consider location/radiation/quality/duration/timing/severity/associated sxs/prior Treatment) HPI   Megan Wolf is a 40 y.o F who presents to the ED today c/o SOB. Pt recently had a hysterectomy 5 days ago and since her surgery pt states that she has felt intermittently SOB. Pt states that throughout the day she will occasionally gasp for air and run out of breath while talking. Pt also has chest pain with deep inspiration. Pts symptoms have gotten progressively worse since her surgery. Pt contacted her OBGYN today who recommended that pt come to the ED to r/o PE. Pt denies LE edema, CP, syncope, tobacco use, dizziness. NO hx of DVT/PE.   Past Medical History  Diagnosis Date  . GERD (gastroesophageal reflux disease)   . IBS (irritable bowel syndrome)   . IC (interstitial cystitis)    Past Surgical History  Procedure Laterality Date  . Lipoma excision    . Robotic assisted total hysterectomy with bilateral salpingo oopherectomy Bilateral 03/18/2015    Procedure: ROBOTIC ASSISTED TOTAL HYSTERECTOMY WITH BILATERAL SALPINGO OOPHORECTOMY;  Surgeon: Christophe Louis, MD;  Location: [facility];  Service: Gynecology;  Laterality: Bilateral;   History reviewed. No pertinent family history. Social History  Substance Use Topics  . Smoking status: Never Smoker   . Smokeless tobacco: None  . Alcohol Use: Yes     Comment: socially   OB History    No data available     Review of Systems  All other systems reviewed and are negative.     Allergies  Latex  Home Medications   Prior to Admission medications   Medication Sig Start Date End Date Taking? Authorizing Provider  acetaminophen (TYLENOL) 500 MG tablet Take 500 mg by mouth every 6 (six) hours as needed for moderate pain.    Historical Provider,  MD  hydrOXYzine (ATARAX/VISTARIL) 25 MG tablet Take 25-50 mg by mouth at bedtime.    Historical Provider, MD  ibuprofen (ADVIL,MOTRIN) 800 MG tablet Take 1 tablet (800 mg total) by mouth every 8 (eight) hours as needed for moderate pain (mild pain). 03/22/15   Christophe Louis, MD  Multiple Vitamin (MULTIVITAMIN WITH MINERALS) TABS tablet Take 1 tablet by mouth daily.    Historical Provider, MD  oxyCODONE-acetaminophen (PERCOCET/ROXICET) 5-325 MG tablet Take 1-2 tablets by mouth every 6 (six) hours as needed (moderate to severe pain (when tolerating fluids)). 03/19/15   Christophe Louis, MD  polyethylene glycol [facility] / Floria Raveling) packet Take 17 g by mouth daily.    Historical Provider, MD   BP 118/77 mmHg  Pulse 75  Temp(Src) 98.4 F (36.9 C)  Resp 24  SpO2 100%  LMP 02/24/2015 Physical Exam  Constitutional: She is oriented to person, place, and time. She appears well-developed and well-nourished. No distress.  HENT:  Head: Normocephalic and atraumatic.  Mouth/Throat: No oropharyngeal exudate.  Eyes: Conjunctivae and EOM are normal. Pupils are equal, round, and reactive to light. Right eye exhibits no discharge. Left eye exhibits no discharge. No scleral icterus.  Cardiovascular: Normal rate, regular rhythm, normal heart sounds and intact distal pulses.  Exam reveals no gallop and no friction rub.   No murmur heard. Pulmonary/Chest: Effort normal and breath sounds normal. No respiratory distress. She has no wheezes. She has no rales. She exhibits no tenderness.  No  accessory muscle use or retractions.   Abdominal: Soft. Bowel sounds are normal. She exhibits no distension and no mass. There is tenderness. There is no rebound and no guarding.  Incisions are CDI. Diffuse abdominal tenderness.   Musculoskeletal: Normal range of motion. She exhibits no edema.  Neurological: She is alert and oriented to person, place, and time.  Strength 5/5 throughout. No sensory deficits.  No gait abnormality.  Skin:  Skin is warm and dry. No rash noted. She is not diaphoretic. No erythema. No pallor.  Psychiatric: She has a normal mood and affect. Her behavior is normal.  Nursing note and vitals reviewed.   ED Course  Procedures (including critical care time) Labs Review Labs Reviewed  D-DIMER, QUANTITATIVE (NOT AT [facility]) - Abnormal; Notable for the following:    D-Dimer, Quant 2.55 (*)    All other components within normal limits  BASIC METABOLIC PANEL  CBC  I-STAT TROPOININ, ED    Imaging Review No results found. I have personally reviewed and evaluated these images and lab results as part of my medical decision-making.   EKG Interpretation   Date/Time:  Monday March 23 2015 09:51:04 EST Ventricular Rate:  70 PR Interval:  144 QRS Duration: 130 QT Interval:  418 QTC Calculation: 451 R Axis:   92 Text Interpretation:  Normal sinus rhythm Rightward axis Non-specific  intra-ventricular conduction block Cannot rule out Septal infarct , age  undetermined Abnormal ECG Confirmed by Lita Mains  MD, DAVID ([id number]) on  03/23/2015 2:14:48 PM      MDM   Final diagnoses:  Dyspnea    Otherwise healthy 40 y.o F present 5 day post op hysterectomy with shortness of breath. Pt was sent here by her OBGYN to r/o PE. Pt has had progressivelvely worsening shortness of breath since her surgery. The dyspnea comes and goes, not necessarily worsened with exertion. No LE edema. No tobacco use. On presentation, pt appears well. No tachypnea, tachycardia or hypoxia. Diffuse abdominal tenderness present, but incisions appear CDI. Troponin wnl. EKG unremarkable. D-dimer elevated at 2.55. Will CT to r/o PE.   CT reveals no acute PE. However, it reveals trace bilateral pleural effusions and large volume of free intraperitoneal air with air identified in the anterior abdominal and chest wall.  Spoke with Dr. Landry Mellow, pts OBGYN who performed her surgery to review the results of the CT scan. She states that the small  pleural effusions are not of concern and shoul not account for pts SOB. She states that the free air is likely post surgical but is unsure if this would cause pt to be SOB. Recommend consulting radiology.  Spoke with radiology who states that the changes on CT appear to be post surgical, however this is unlikely the etiology of pts symptoms.   Pt ambulated with pulse ox remaining above 90%. Pt speaking in complete sentences. All VSS. Pt given albuterol inhaler.Feel that pt is safe to follow up with OBGYN this week. STRICT return precautions given. Discussed treatment plan with pt who agrees with treatment plan. Pt hemodynamically stable and ready for discharge.   Patient was discussed with and seen by Dr. Lita Mains who agrees with the treatment plan.      Dondra Spry [place], PA-C 03/25/15 1854  Julianne Rice, MD 03/27/15 [id number]

## 2015-03-23 NOTE — ED Notes (Signed)
Patient able to ambulate independently  

## 2015-03-23 NOTE — ED Notes (Signed)
Called CT and informed them pt is ready for scan.

## 2015-03-25 NOTE — Progress Notes (Signed)
Cardiology Consult Referring: Donnald Garre, PA-C  Chief Complaint  Patient presents with  . Shortness of Breath    pt having SOB after hysterectomy 03/18/15   History of Present Illness: 40 yo female with history of GERD, IBS who is here today as a new patient for consultation and evaluation of dyspnea. She has undergone a hysterectomy on 03/18/15 and since then has had dyspnea. She was seen in the ED 03/23/15 and CTA chest was negative for PE but did show trivial pleural effusions. EKG showed an IVCD but sinus rhythm. Hgb post op was 10 from baseline of 12. She has no LE edema. She does have pleuritic chest pain. She has on weight gain. No exertional chest pain but she does have dyspnea with moderate exertion. This seems to be improving.   Primary Care Physician: Lilian Coma, MD  Past Medical History  Diagnosis Date  . GERD (gastroesophageal reflux disease)   . IBS (irritable bowel syndrome)   . IC (interstitial cystitis)     Past Surgical History  Procedure Laterality Date  . Lipoma excision    . Robotic assisted total hysterectomy with bilateral salpingo oopherectomy Bilateral 03/18/2015  . Robotic assisted total hysterectomy with salpingectomy Bilateral 03/18/2015    Procedure: ROBOTIC ASSISTED TOTAL HYSTERECTOMY WITH BILATERAL SALPINGO OOPHORECTOMY;  Surgeon: Christophe Louis, MD;  Location: [facility];  Service: Gynecology;  Laterality: Bilateral;    Current Outpatient Prescriptions  Medication Sig Dispense Refill  . hydrOXYzine (ATARAX/VISTARIL) 25 MG tablet Take 25-50 mg by mouth at bedtime.    Marland Kitchen ibuprofen (ADVIL,MOTRIN) 800 MG tablet Take 1 tablet (800 mg total) by mouth every 8 (eight) hours as needed for moderate pain (mild pain). 30 tablet 1  . Multiple Vitamin (MULTIVITAMIN WITH MINERALS) TABS tablet Take 1 tablet by mouth daily.    . polyethylene glycol (MIRALAX / GLYCOLAX) packet Take 17 g by mouth daily.     No current facility-administered medications for this visit.     Allergies  Allergen Reactions  . Latex Rash    Social History   Social History  . Marital Status: Married    Spouse Name: N/A  . Number of Children: 2  . Years of Education: N/A   Occupational History  . Sales    Social History Main Topics  . Smoking status: Never Smoker   . Smokeless tobacco: Not on file  . Alcohol Use: Yes     Comment: socially  . Drug Use: No  . Sexual Activity: Not on file   Other Topics Concern  . Not on file   Social History Narrative    Family History  Problem Relation Age of Onset  . Hypertension Father   . Hypertension Mother     Review of Systems:  As stated in the HPI and otherwise negative.   BP 96/62 mmHg  Pulse 72  Ht 5' 5.5" (1.664 m)  Wt 173 lb 3.2 oz (78.563 kg)  BMI 28.37 kg/m2  SpO2 97%  LMP 02/24/2015  Physical Examination: General: Well developed, well nourished, NAD HEENT: OP clear, mucus membranes moist SKIN: warm, dry. No rashes. Neuro: No focal deficits Musculoskeletal: Muscle strength 5/5 all ext Psychiatric: Mood and affect normal Neck: No JVD, no carotid bruits, no thyromegaly, no lymphadenopathy. Lungs:Clear bilaterally, no wheezes, rhonci, crackles Cardiovascular: Regular rate and rhythm. No murmurs, gallops or rubs. Abdomen:Soft. Bowel sounds present. Non-tender.  Extremities: No lower extremity edema. Pulses are 2 + in the bilateral DP/PT.  EKG:  EKG is not ordered  today. I reviewed the EKG from 03/23/15 which showed sinus rhythm with IVCD. Poor R wave progressin precordial leads.   Recent Labs: 03/23/2015: BUN 7; Creatinine, Ser 0.93; Hemoglobin 12.1; Platelets 292; Potassium 4.0; Sodium 141   Lipid Panel No results found for: CHOL, TRIG, HDL, CHOLHDL, VLDL, LDLCALC, LDLDIRECT   Wt Readings from Last 3 Encounters:  03/26/15 173 lb 3.2 oz (78.563 kg)  03/18/15 169 lb (76.658 kg)  03/11/15 169 lb (76.658 kg)     Other studies Reviewed: Additional studies/ records that were reviewed today  include: CTA chest, chest x-ray reports. ED visit report.  Review of the above records demonstrates:    Assessment and Plan:   1. Dyspnea: She has post-operative dyspnea but no evidence of PE on CTA chest. There are trivial bilateral pleural effusions and she has pleuritic type chest pain. She was also anemic following her procedure. Fortunately, her dyspnea seems to be improving. I do not think her dyspnea is related to the intraventricular conduction delay on her EKG but I will arrange an echocardiogram to assess LVEF and exclude structural heart disease.   Current medicines are reviewed at length with the patient today.  The patient does not have concerns regarding medicines.  The following changes have been made:  no change  Labs/ tests ordered today include:   Orders Placed This Encounter  Procedures  . Echocardiogram    Disposition:   FU with me in 3 weeks  Signed, Lauree Chandler, MD 03/26/2015 11:11 AM    [facility] [address], [place], [place]  [address] Phone: [phone]; Fax: [phone]

## 2015-03-26 ENCOUNTER — Encounter: Payer: Self-pay | Admitting: Cardiovascular Disease

## 2015-03-26 ENCOUNTER — Ambulatory Visit (INDEPENDENT_AMBULATORY_CARE_PROVIDER_SITE_OTHER): Payer: BLUE CROSS/BLUE SHIELD | Admitting: Cardiovascular Disease

## 2015-03-26 VITALS — BP 96/62 | HR 72 | Ht 65.5 in | Wt 173.2 lb

## 2015-03-26 DIAGNOSIS — R0600 Dyspnea, unspecified: Secondary | ICD-10-CM

## 2015-03-26 NOTE — Patient Instructions (Addendum)
Medication Instructions:  Your physician recommends that you continue on your current medications as directed. Please refer to the Current Medication list given to you today.   Labwork: none  Testing/Procedures: Your physician has requested that you have an echocardiogram. Echocardiography is a painless test that uses sound waves to create images of your heart. It provides your doctor with information about the size and shape of your heart and how well your heart's chambers and valves are working. This procedure takes approximately one hour. There are no restrictions for this procedure.    Follow-Up: Your physician recommends that you schedule a follow-up appointment in: 4-6 weeks. --Scheduled for March 1,2017 at 9:00    Any Other Special Instructions Will Be Listed Below (If Applicable).     If you need a refill on your cardiac medications before your next appointment, please call your pharmacy.

## 2015-04-07 ENCOUNTER — Ambulatory Visit (HOSPITAL_COMMUNITY): Payer: BLUE CROSS/BLUE SHIELD | Attending: Cardiology

## 2015-04-07 ENCOUNTER — Other Ambulatory Visit: Payer: Self-pay

## 2015-04-07 ENCOUNTER — Telehealth: Payer: Self-pay | Admitting: Cardiovascular Disease

## 2015-04-07 DIAGNOSIS — I340 Nonrheumatic mitral (valve) insufficiency: Secondary | ICD-10-CM | POA: Diagnosis not present

## 2015-04-07 DIAGNOSIS — R0600 Dyspnea, unspecified: Secondary | ICD-10-CM | POA: Diagnosis not present

## 2015-04-07 NOTE — Telephone Encounter (Signed)
New problem ° ° °Pt returning your call. °

## 2015-04-07 NOTE — Telephone Encounter (Signed)
I would recommend that she get into primary care for evaluation to look for pulmonary causes of her dyspnea. I am not sure the inhaler will help unless this is asthma.

## 2015-04-07 NOTE — Telephone Encounter (Signed)
I spoke with pt and reviewed echo results with her.  She reports she continues to have shortness of breath. This occurs at rest, with change in position and with exertion. Happens several times a day. Only slight improvement since office visit. Was given inhaler when in ED but has not been using. She is asking if she should resume this. She is not wheezing.  No history of asthma.

## 2015-04-07 NOTE — Telephone Encounter (Signed)
Spoke with pt and gave her information from Dr. McAlhany 

## 2015-04-27 ENCOUNTER — Ambulatory Visit: Payer: BC Managed Care – PPO | Admitting: Cardiovascular Disease

## 2015-05-05 NOTE — Progress Notes (Signed)
No show

## 2015-05-06 ENCOUNTER — Telehealth: Payer: Self-pay | Admitting: Cardiovascular Disease

## 2015-05-06 ENCOUNTER — Encounter: Payer: BLUE CROSS/BLUE SHIELD | Admitting: Cardiovascular Disease

## 2015-05-06 NOTE — Telephone Encounter (Signed)
No show for cardiac follow up with me today.   Megan Wolf

## 2015-05-20 ENCOUNTER — Encounter: Payer: Self-pay | Admitting: Cardiovascular Disease

## 2016-07-04 IMAGING — CT CT ANGIO CHEST
2 of 6 series · 19 of 36 positions shown · IV contrast (Omni 300)
Comparison: PA and lateral chest earlier today. CT chest
04/12/2005.

CLINICAL DATA: Shortness of breath and elevated D-dimer. Status
post hysterectomy 03/18/2015. Question pulmonary embolus. Initial
encounter.

EXAM:
CT ANGIOGRAPHY CHEST WITH CONTRAST
TECHNIQUE: Multidetector CT imaging of the chest was performed using the
standard protocol during bolus administration of intravenous
contrast. Multiplanar CT image reconstructions and MIPs were
obtained to evaluate the vascular anatomy.
CONTRAST:  100 mL OMNIPAQUE IOHEXOL 350 MG/ML SOLN

[Series 9: pe thins · axial · 0.72mm/px · z∈[-485,-243]mm · 18 of 381 slices shown]
[im 18/381  lung]
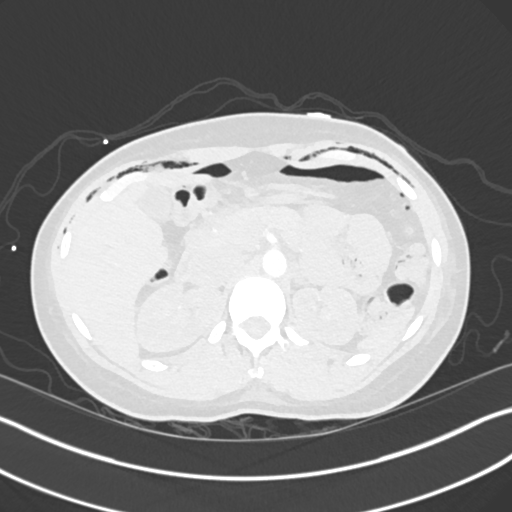
[im 35/381  mediastinal]
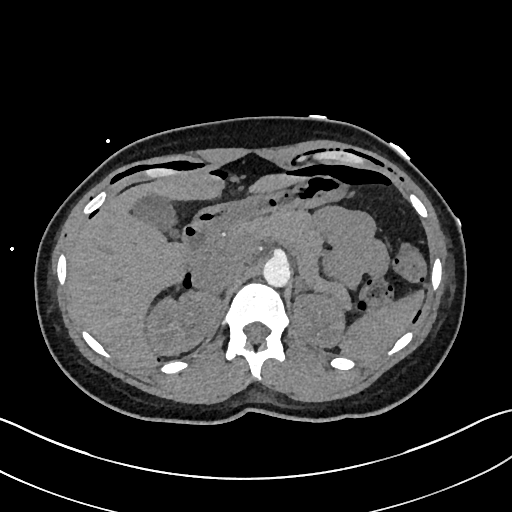
[im 52/381  lung]
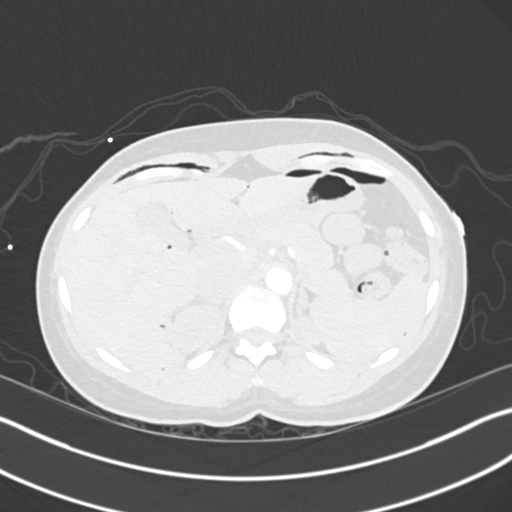
[im 87/381  mediastinal]
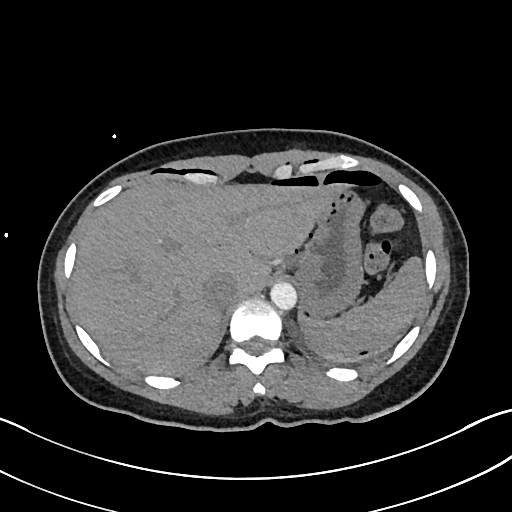
[im 104/381  lung]
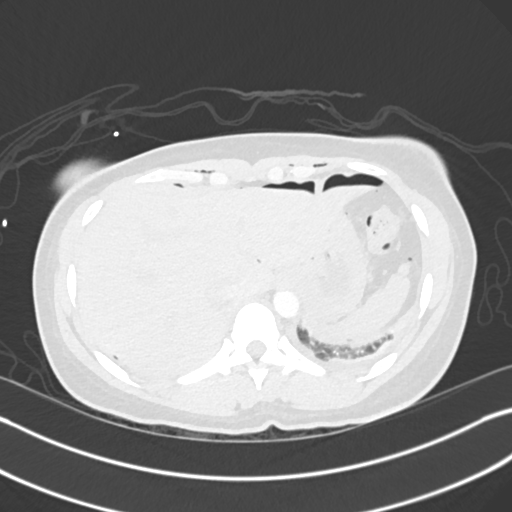
[im 121/381  mediastinal]
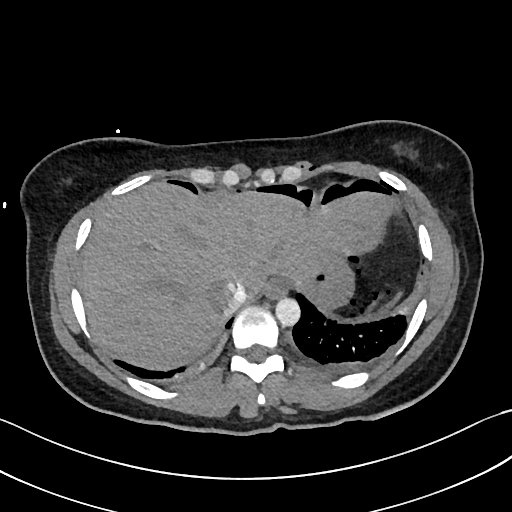
[im 139/381  lung]
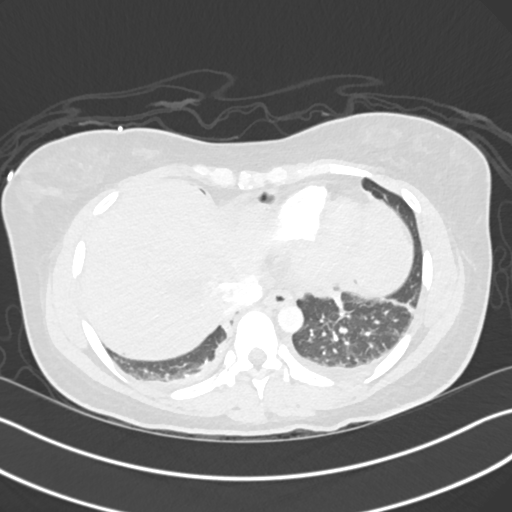
[im 156/381  mediastinal]
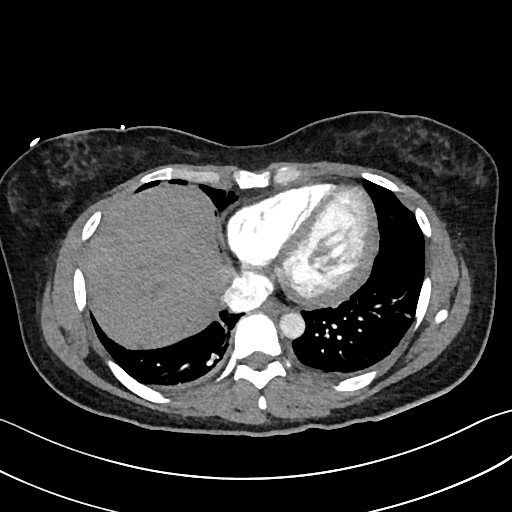
[im 173/381  lung]
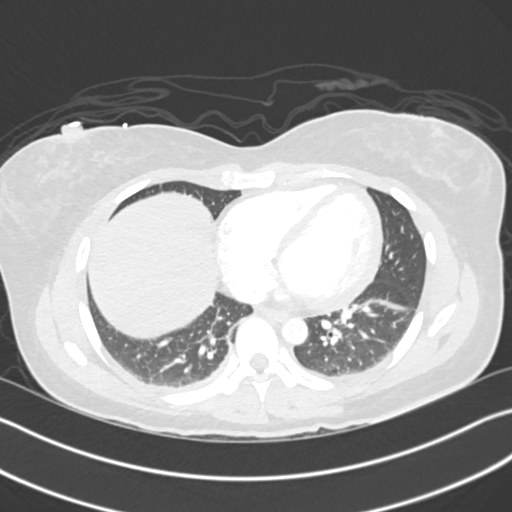
[im 208/381  mediastinal]
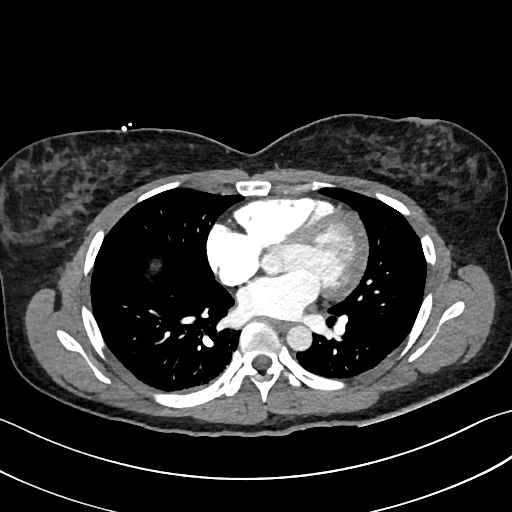
[im 225/381  lung]
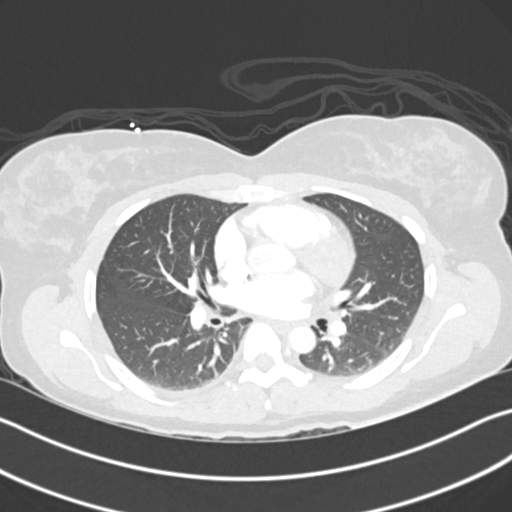
[im 242/381  mediastinal]
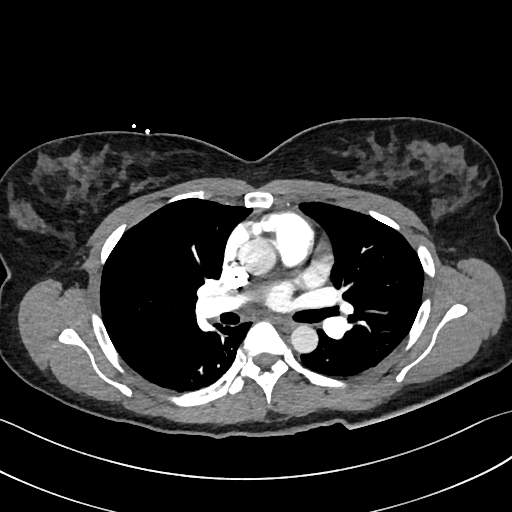
[im 260/381  lung]
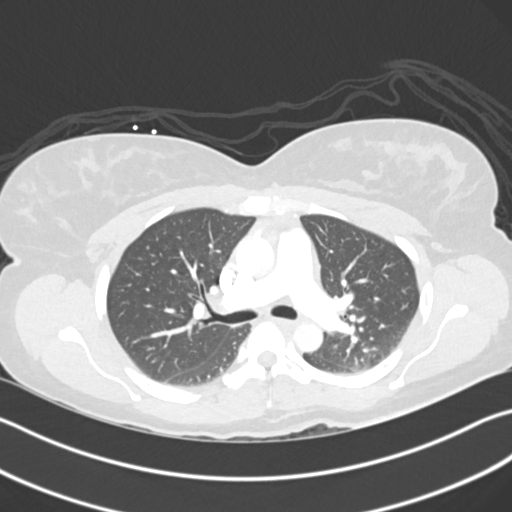
[im 277/381  mediastinal]
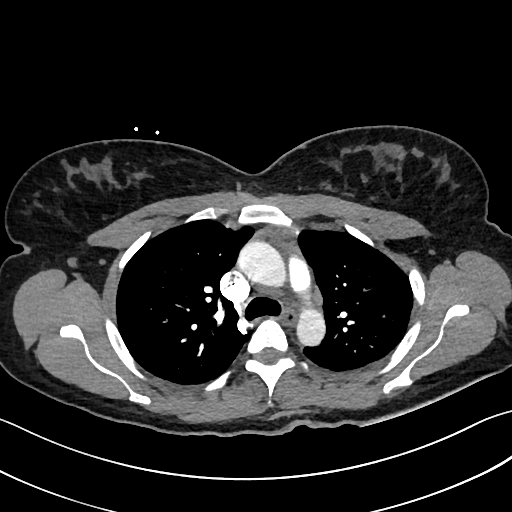
[im 294/381  lung]
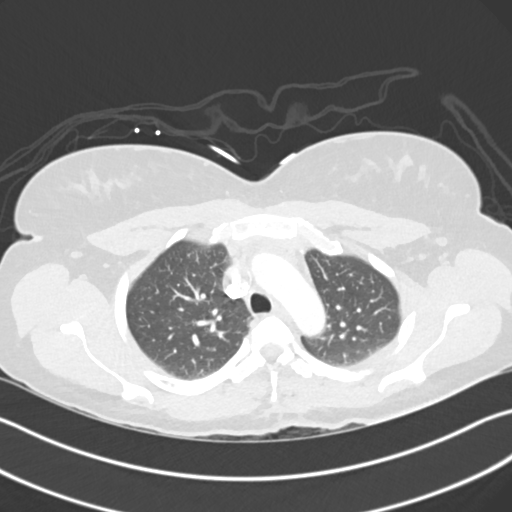
[im 329/381  mediastinal]
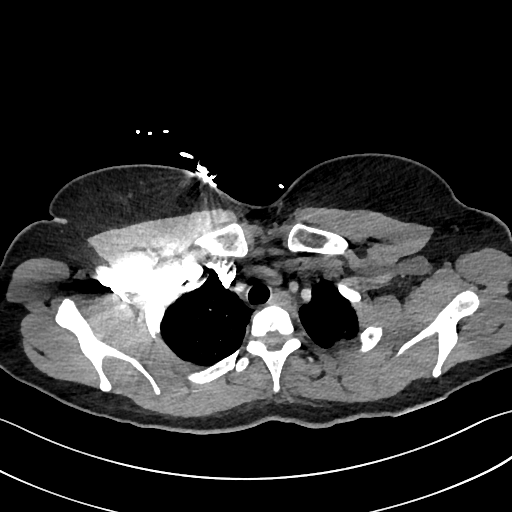
[im 346/381  lung]
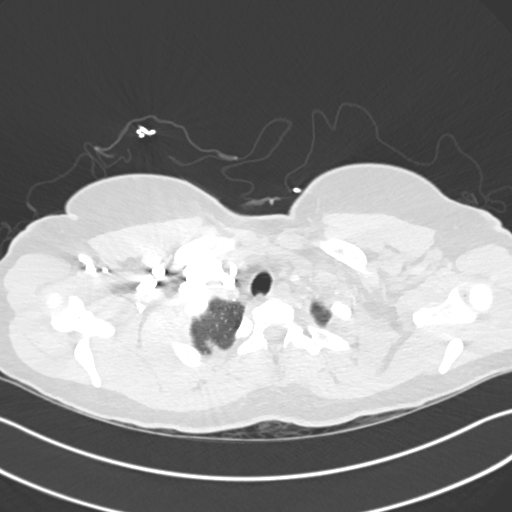
[im 363/381  mediastinal]
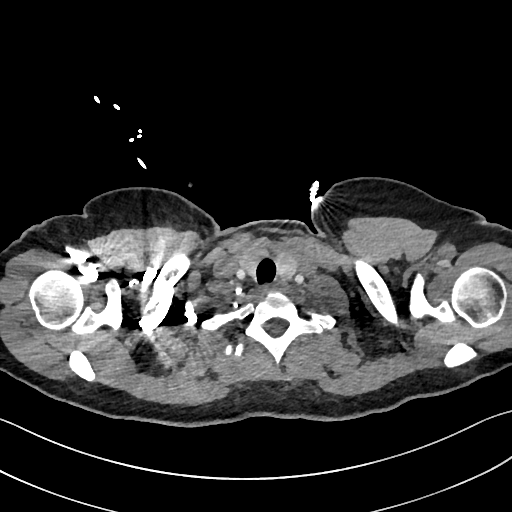

[Series 10: pe 2mm cor · coronal · 0.54mm/px · 1 of 73 slices shown]
[im 37/73  mediastinal]
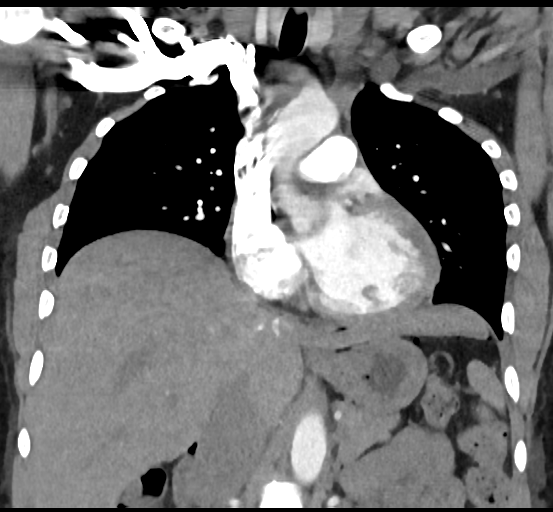

[19 of 36 positions shown; findings below may reference images not displayed]

FINDINGS: No pulmonary embolus is identified.

There are very small bilateral pleural effusions. No pericardial
effusion. Heart size is normal. No axillary, hilar or mediastinal
lymphadenopathy. The lungs demonstrate only mild dependent
atelectasis.

Visualized upper abdomen shows a large volume of free
intraperitoneal air. Gas is also seen in the upper abdominal and
lower chest wall anteriorly. No focal bony abnormality.

Review of the MIP images confirms the above findings.
IMPRESSION: Negative for pulmonary embolus.

Trace bilateral pleural effusions.

Large volume of free intraperitoneal air with air also identified in
anterior abdominal and chest wall presumably related to the
patient's recent surgery.

## 2017-02-08 ENCOUNTER — Ambulatory Visit (INDEPENDENT_AMBULATORY_CARE_PROVIDER_SITE_OTHER): Payer: BLUE CROSS/BLUE SHIELD

## 2017-02-08 ENCOUNTER — Ambulatory Visit (INDEPENDENT_AMBULATORY_CARE_PROVIDER_SITE_OTHER): Payer: BLUE CROSS/BLUE SHIELD | Admitting: Orthopaedic Surgery

## 2017-02-08 ENCOUNTER — Encounter (INDEPENDENT_AMBULATORY_CARE_PROVIDER_SITE_OTHER): Payer: Self-pay | Admitting: Orthopaedic Surgery

## 2017-02-08 VITALS — BP 126/87 | HR 68 | Ht 65.5 in | Wt 185.0 lb

## 2017-02-08 DIAGNOSIS — M25511 Pain in right shoulder: Secondary | ICD-10-CM | POA: Diagnosis not present

## 2017-02-08 DIAGNOSIS — G8929 Other chronic pain: Secondary | ICD-10-CM | POA: Diagnosis not present

## 2017-02-08 NOTE — Progress Notes (Signed)
Office Visit Note   Patient: Megan Wolf           Date of Birth: 25-Oct-1975           MRN: [id number] Visit Date: 02/08/2017              Requested by: Jonathon Jordan, MD [address] [place], [place] [address] PCP: Jonathon Jordan, MD   Assessment & Plan: Visit Diagnoses:  1. Chronic right shoulder pain     Plan: Patient has some calcification that looks like it is in the biceps tendon or possibly in the inferior aspect of the subscap.  She is continue to have anterior shoulder pain and has failed activity modification and has had pain for greater than 3 years.  Previous injection did not give her relief.  We will proceed with arthrogram MRI to rule out a SLAP tear or partial biceps tendon tear.  She has good supraspinatus strength and might have some partial tearing of the supraspinatus.  Office follow-up after scan  Follow-Up Instructions: Office follow-up after right shoulder arthrogram MRI scan. Orders:  Orders Placed This Encounter  Procedures  . XR Shoulder Right   No orders of the defined types were placed in this encounter.     Procedures: No procedures performed   Clinical Data: No additional findings.   Subjective: Chief Complaint  Patient presents with  . Right Shoulder - Pain    HPI 41 year old female returns with persistent problems with right shoulder pain and right neck pain.  I did ACL reconstruction on her daughter many years ago.  Injection was performed in her right shoulder in 2015 she states it did not help and she has had persistent increasing pain now for 3 years and finally returned.  She has pain with outstretched reaching pain with turning her neck no pain or numbness that extends down to the hand.  Problems fixing her hair getting dressed reaching overhead.  No problems on the left upper extremity no gait disturbance no fever or chills.  Review of Systems pus wrist or bladder problems interstitial cystitis right  shoulder impingement.   Objective: Vital Signs: BP 126/87   Pulse 68   Ht 5' 5.5" (1.664 m)   Wt 185 lb (83.9 kg)   LMP 02/24/2015   BMI 30.32 kg/m   Physical Exam  Constitutional: She is oriented to person, place, and time. She appears well-developed.  HENT:  Head: Normocephalic.  Right Ear: External ear normal.  Left Ear: External ear normal.  Eyes: Pupils are equal, round, and reactive to light.  Neck: No tracheal deviation present. No thyromegaly present.  Cardiovascular: Normal rate.  Pulmonary/Chest: Effort normal.  Abdominal: Soft.  Neurological: She is alert and oriented to person, place, and time.  Skin: Skin is warm and dry.  Psychiatric: She has a normal mood and affect. Her behavior is normal.    Ortho Exam patient is moderate to severe brachial plexus tenderness on the right negative on the left positive Spurling on the right neck negative for me.  Upper extremity reflexes are 2+.  Negative drop arm test positive impingement test on the right.  AC joint is normal negative crossarm adduction test.  Normal heel toe gait.  No weakness wrist flexion extension.  Sensation is normal median ulnar distribution of the hand.  No biceps triceps atrophy.  Specialty Comments:  No specialty comments available.  Imaging: Xr Shoulder Right  Result Date: 02/08/2017 Three-view x-rays right shoulder obtained and  reviewed.  This shows no glenohumeral arthritis.  There is slight calcification anteriorly which may be in the biceps tendon in the bicipital groove.  Slight sclerosis of the greater tuberosity.  Lung field that is visualized is clear. Impression: Normal position glenohumeral joint without degenerative changes no acute changes.    PMFS History: Patient Active Problem List   Diagnosis Date Noted  . Fibroids 03/18/2015  . Menorrhagia 03/18/2015  . S/P hysterectomy 03/18/2015   Past Medical History:  Diagnosis Date  . GERD (gastroesophageal reflux disease)   . IBS  (irritable bowel syndrome)   . IC (interstitial cystitis)     Family History  Problem Relation Age of Onset  . Hypertension Father   . Hypertension Mother     Past Surgical History:  Procedure Laterality Date  . LIPOMA EXCISION    . ROBOTIC ASSISTED TOTAL HYSTERECTOMY WITH BILATERAL SALPINGO OOPHERECTOMY Bilateral 03/18/2015  . ROBOTIC ASSISTED TOTAL HYSTERECTOMY WITH SALPINGECTOMY Bilateral 03/18/2015   Procedure: ROBOTIC ASSISTED TOTAL HYSTERECTOMY WITH BILATERAL SALPINGO OOPHORECTOMY;  Surgeon: Christophe Louis, MD;  Location: [facility];  Service: Gynecology;  Laterality: Bilateral;   Social History   Occupational History  . Occupation: Sales  Tobacco Use  . Smoking status: Never Smoker  . Smokeless tobacco: Never Used  Substance and Sexual Activity  . Alcohol use: Yes    Comment: socially  . Drug use: No  . Sexual activity: Not on file

## 2017-02-08 NOTE — Addendum Note (Signed)
Addended by: Maxcine Ham on: 02/08/2017 12:42 PM   Modules accepted: Orders

## 2017-02-08 NOTE — Addendum Note (Signed)
Addended by: Maxcine Ham on: 02/08/2017 12:34 PM   Modules accepted: Orders

## 2017-02-10 ENCOUNTER — Telehealth (INDEPENDENT_AMBULATORY_CARE_PROVIDER_SITE_OTHER): Payer: Self-pay | Admitting: Orthopaedic Surgery

## 2017-02-10 NOTE — Telephone Encounter (Signed)
Script faxed.

## 2017-02-10 NOTE — Telephone Encounter (Signed)
Fax number  [phone]  Just orders on why they are seeing pt on Tuesday.Floral arthrogram follow by MRI arthrogram.

## 2017-02-16 ENCOUNTER — Ambulatory Visit (INDEPENDENT_AMBULATORY_CARE_PROVIDER_SITE_OTHER): Payer: BLUE CROSS/BLUE SHIELD | Admitting: Orthopaedic Surgery

## 2017-02-16 ENCOUNTER — Encounter (INDEPENDENT_AMBULATORY_CARE_PROVIDER_SITE_OTHER): Payer: Self-pay | Admitting: Orthopaedic Surgery

## 2017-02-16 VITALS — BP 115/76 | HR 64 | Ht 65.5 in | Wt 185.0 lb

## 2017-02-16 DIAGNOSIS — S43011D Anterior subluxation of right humerus, subsequent encounter: Secondary | ICD-10-CM | POA: Diagnosis not present

## 2017-02-16 NOTE — Addendum Note (Signed)
Addended by: Meyer Cory on: 02/16/2017 09:35 AM   Modules accepted: Orders

## 2017-02-16 NOTE — Progress Notes (Signed)
Office Visit Note   Patient: Megan Wolf           Date of Birth: Jan 02, 1976           MRN: [id number] Visit Date: 02/16/2017              Requested by: Jonathon Jordan, MD [address] [place], [place] [address] PCP: Jonathon Jordan, MD   Assessment & Plan: Visit Diagnoses:  1. Anterior subluxation of right shoulder, subsequent encounter     Plan: Shoulder with Hill-Sachs lesion and some labral fraying but no definite Bankart tear.  We will set up for physical therapy recheck her after the holidays.  Hopefully she can get improvement in her shoulder symptoms with therapy alone otherwise shoulder stabilization procedure would need to be considered.  Recheck 5 weeks.  Follow-Up Instructions: Return in about 5 weeks (around 03/23/2017).   Orders:  No orders of the defined types were placed in this encounter.  No orders of the defined types were placed in this encounter.     Procedures: No procedures performed   Clinical Data: No additional findings.   Subjective: Chief Complaint  Patient presents with  . Right Shoulder - Pain    MRI review    HPI she returns with persistent problems with her right shoulder.  She states she now does recall after daughter mentioned in the one time she was reaching back behind her while she was in the car to reach for her son and suddenly felt sharp pain in her shoulder and cried out.  She does not recall any falls or other traumatic incidences.  Patient had the MRI scan arthrogram right shoulder is here for review.  Review of Systems 14 point review of systems updated from last office visit and and is unchanged.   Objective: Vital Signs: BP 115/76   Pulse 64   Ht 5' 5.5" (1.664 m)   Wt 185 lb (83.9 kg)   LMP 02/24/2015   BMI 30.32 kg/m   Physical Exam  Constitutional: She is oriented to person, place, and time. She appears well-developed.  HENT:  Head: Normocephalic.  Right Ear: External ear  normal.  Left Ear: External ear normal.  Eyes: Pupils are equal, round, and reactive to light.  Neck: No tracheal deviation present. No thyromegaly present.  Cardiovascular: Normal rate.  Pulmonary/Chest: Effort normal.  Abdominal: Soft.  Neurological: She is alert and oriented to person, place, and time.  Skin: Skin is warm and dry.  Psychiatric: She has a normal mood and affect. Her behavior is normal.    Ortho Exam   negative subluxation left shoulder.  Negative drop arm test right and left.  She has some mild tenderness to the brachial plexus.  Positive apprehension with external rotation left shoulder at 80-90 degrees.  There is some anterior subluxation of the humeral head palpable with external rotation.  She has problems with internal rotation reaching behind her.  Upper extremity reflexes are 2+ normal heel toe gait no isolated motor weakness.  3 to the hand is normal.  Specialty Comments:  No specialty comments available.  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  There is adjacent labral fraying.  No detached labral tear is noted.  Partial-thickness 6 mm rotator cuff anterior supraspinatus partial-thickness tear noted.   PMFS History: Patient Active Problem List  Diagnosis Date Noted  . Fibroids 03/18/2015  . Menorrhagia 03/18/2015  . S/P hysterectomy 03/18/2015   Past Medical History:  Diagnosis Date  . GERD (gastroesophageal reflux disease)   . IBS (irritable bowel syndrome)   . IC (interstitial cystitis)     Family History  Problem Relation Age of Onset  . Hypertension Father   . Hypertension Mother     Past Surgical History:  Procedure Laterality Date  . LIPOMA EXCISION    . ROBOTIC ASSISTED TOTAL HYSTERECTOMY WITH BILATERAL SALPINGO OOPHERECTOMY Bilateral 03/18/2015  .  ROBOTIC ASSISTED TOTAL HYSTERECTOMY WITH SALPINGECTOMY Bilateral 03/18/2015   Procedure: ROBOTIC ASSISTED TOTAL HYSTERECTOMY WITH BILATERAL SALPINGO OOPHORECTOMY;  Surgeon: Christophe Louis, MD;  Location: [facility];  Service: Gynecology;  Laterality: Bilateral;   Social History   Occupational History  . Occupation: Sales  Tobacco Use  . Smoking status: Never Smoker  . Smokeless tobacco: Never Used  Substance and Sexual Activity  . Alcohol use: Yes    Comment: socially  . Drug use: No  . Sexual activity: Not on file

## 2017-02-20 ENCOUNTER — Encounter: Payer: Self-pay | Admitting: Physical Therapy

## 2017-02-20 ENCOUNTER — Ambulatory Visit: Payer: BLUE CROSS/BLUE SHIELD | Attending: Orthopaedic Surgery | Admitting: Physical Therapy

## 2017-02-20 DIAGNOSIS — M62838 Other muscle spasm: Secondary | ICD-10-CM | POA: Insufficient documentation

## 2017-02-20 DIAGNOSIS — M25511 Pain in right shoulder: Secondary | ICD-10-CM | POA: Insufficient documentation

## 2017-02-20 DIAGNOSIS — M6281 Muscle weakness (generalized): Secondary | ICD-10-CM | POA: Insufficient documentation

## 2017-02-20 DIAGNOSIS — M25611 Stiffness of right shoulder, not elsewhere classified: Secondary | ICD-10-CM | POA: Insufficient documentation

## 2017-02-20 DIAGNOSIS — G8929 Other chronic pain: Secondary | ICD-10-CM | POA: Diagnosis present

## 2017-02-20 NOTE — Therapy (Signed)
[facility] [address], [place], [place], [address] Phone: [phone]   Fax:  [phone]  Physical Therapy Evaluation  Patient Details  Name: Megan Wolf MRN: [id number] Date of Birth: 1975-11-06 Referring Provider: Dr. Rodell Perna   Encounter Date: 02/20/2017  PT End of Session - 02/20/17 1314    Visit Number  1    Date for PT Re-Evaluation  04/17/17    Authorization Type  BCBS    PT Start Time  1230    PT Stop Time  1307    PT Time Calculation (min)  37 min    Activity Tolerance  Patient tolerated treatment well    Behavior During Therapy  [facility] for tasks assessed/performed       Past Medical History:  Diagnosis Date  . GERD (gastroesophageal reflux disease)   . IBS (irritable bowel syndrome)   . IC (interstitial cystitis)     Past Surgical History:  Procedure Laterality Date  . LIPOMA EXCISION    . ROBOTIC ASSISTED TOTAL HYSTERECTOMY WITH BILATERAL SALPINGO OOPHERECTOMY Bilateral 03/18/2015  . ROBOTIC ASSISTED TOTAL HYSTERECTOMY WITH SALPINGECTOMY Bilateral 03/18/2015   Procedure: ROBOTIC ASSISTED TOTAL HYSTERECTOMY WITH BILATERAL SALPINGO OOPHORECTOMY;  Surgeon: Christophe Louis, MD;  Location: [facility];  Service: Gynecology;  Laterality: Bilateral;    There were no vitals filed for this visit.   Subjective Assessment - 02/20/17 1233    Subjective  Patient reports 3 years ago and happened when reaching behind her.  Patient reports her pain is more consistent in the past 6 months.      Patient Stated Goals  Avoid surgery, reduce pain, improve strength    Currently in Pain?  Yes    Pain Score  4     Pain Location  Shoulder    Pain Orientation  Right    Pain Descriptors / Indicators  Aching;Sharp    Pain Type  Chronic pain    Pain Onset  More than a month ago    Pain Frequency  Intermittent    Aggravating Factors   pick something up with right arm, reach behind back, driving for a long period    Pain Relieving Factors  not using the right arm    Multiple Pain Sites  No         [facility] PT Assessment - 02/20/17 0001      Assessment   Medical Diagnosis  S43.011D Anterior subluxation of right shoulder, subsequent encounter    Referring Provider  Dr. Rodell Perna    Onset Date/Surgical Date  08/21/16    Hand Dominance  Right    Prior Therapy  [facility]      Precautions   Precautions  None      Restrictions   Weight Bearing Restrictions  No      Balance Screen   Has the patient fallen in the past 6 months  No    Has the patient had a decrease in activity level because of a fear of falling?   No    Is the patient reluctant to leave their home because of a fear of falling?   No      Home Film/video editor residence      Prior Function   Level of Independence  Independent    Vocation  Full time employment    Vocation Requirements  lap top; shaking hands; driving    Leisure  walk      Cognition  Overall Cognitive Status  Within Functional Limits for tasks assessed      Observation/Other Assessments   Focus on Therapeutic Outcomes (FOTO)   limited by 48%; goal is 30% limitation      Posture/Postural Control   Posture/Postural Control  No significant limitations      ROM / Strength   AROM / PROM / Strength  AROM;PROM;Strength      AROM   Overall AROM Comments  left cervical sidebending decreased by 50%    AROM Assessment Site  Shoulder    Right/Left Shoulder  Right    Right Shoulder Flexion  120 Degrees    Right Shoulder ABduction  85 Degrees    Right Shoulder Internal Rotation  55 Degrees    Right Shoulder External Rotation  55 Degrees      PROM   PROM Assessment Site  Shoulder    Right/Left Shoulder  Right    Right Shoulder Flexion  105 Degrees    Right Shoulder ABduction  105 Degrees    Right Shoulder Internal Rotation  60 Degrees    Right Shoulder External Rotation  65 Degrees      Strength   Overall Strength Comments  right scapula  strength 3/5    Strength Assessment Site  Shoulder    Right/Left Shoulder  Right    Right Shoulder Flexion  2/5    Right Shoulder Extension  3/5    Right Shoulder ABduction  2/5    Right Shoulder Internal Rotation  2/5    Right Shoulder External Rotation  2/5      Palpation   Spinal mobility  decreased movement of T1-T3, T10-T12    Palpation comment  tenderness at right A-C joint; Posterior axilla; right T1-T3 area; right subscapularis; right upper trap; right rib cage             Objective measurements completed on examination: See above findings.              PT Education - 02/20/17 1314    Education provided  Yes    Education Details  right shoulder ROM exercises; information on dry needling    Person(s) Educated  Patient    Methods  Explanation;Demonstration;Verbal cues;Handout    Comprehension  Returned demonstration;Verbalized understanding       PT Short Term Goals - 02/20/17 1357      PT SHORT TERM GOAL #1   Title  independent with initial HEP    Time  4    Period  Weeks    Status  New    Target Date  03/20/17      PT SHORT TERM GOAL #2   Title  sleep on right shoulder with pain decreased >/= 25%    Time  4    Period  Weeks    Status  New    Target Date  03/20/17      PT SHORT TERM GOAL #3   Title  right shoulder flexion>./= 130 degrees  due to improve mobility of right shoulder joint and scapula mobility    Time  4    Period  Weeks    Status  New    Target Date  03/20/17      PT SHORT TERM GOAL #4   Title  right shoulder abduction PROM >/= 130 degrees due to improved mobility of shoulder joint and scapula    Time  4    Period  Weeks    Status  New  Target Date  03/20/17      PT SHORT TERM GOAL #5   Title  right shoulder strength >/= 3/5 so she is able to do daily acitivties with greater ease    Time  4    Period  Weeks    Status  New    Target Date  03/20/17        PT Long Term Goals - 02/20/17 1605      PT LONG TERM GOAL  #1   Title  independent HEP and understand how  to progress herself    Time  8    Period  Weeks    Status  New    Target Date  04/17/17      PT LONG TERM GOAL #2   Title  sleep on right side with pain decreased >/= 75%    Time  8    Period  Weeks    Status  New    Target Date  04/17/17      PT LONG TERM GOAL #3   Title  able to lift with right arm  her computer bag with </= 75% less pain due to right shoulder strength >/= 4/5    Time  8    Period  Weeks    Status  New    Target Date  04/17/17      PT LONG TERM GOAL #4   Title  able to reach under something with pain in right shoulder </= 75% due to improve right scapula mobility and strength    Time  8    Period  Weeks    Status  New    Target Date  04/17/17      PT LONG TERM GOAL #5   Title  able to reach up and outward to a high shelf due to increased right shoulder AROM >/= 145 degrees for flexion and abduction    Time  8    Period  Weeks    Status  New    Target Date  04/17/17             Plan - 02/20/17 1315    Clinical Impression Statement  Patient is a 41 year old female with right shoulder pain at level 5/10 intermiitently.  Patient report shoulder pain for 3 years but now the pain is consistent.  Patient pain is worse with  using her right arm, reaching overhead, lifting with right arm, and sleeping on right arm.  Right shoulder strength is 2/5 due to limited AROM by 50%.  Patient has decreased mobility at T1-T5 and right side of T10-T12.  Patient has a tight right latissimus dorsi.  Palpable tendenress located in right upper trapezius, right subscapularis, anterior right shoulder, right quadratus lumborum.  When patient flexes her right shoulder she will try to increase her motion by extending at T10-T12.  Patient is limited by 25% for left cervical sidebending.  Patient has decreased mobility of right first rib.  Patient will benefit from skilled therapy to improve right shoulder ROM and strength while reducing  her compensations and decrease pain.     History and Personal Factors relevant to plan of care:  none pertinent    Clinical Presentation  Evolving    Clinical Presentation due to:  difficulty with using right arm for functional tasks and limits her    Clinical Decision Making  Low    Rehab Potential  Excellent    Clinical Impairments Affecting Rehab Potential  None  PT Frequency  2x / week    PT Duration  8 weeks    PT Treatment/Interventions  Cryotherapy;Electrical Stimulation;Iontophoresis 4mg /ml Dexamethasone;Moist Heat;Ultrasound;Therapeutic exercise;Therapeutic activities;Neuromuscular re-education;Patient/family education;Passive range of motion;Manual techniques;Dry needling;Taping    PT Next Visit Plan  dry needling to right upper trap, subscapularis, right rotator cuff; soft tissue work; joint mobilization to right rib cage, right shoulder and scapula modalities as needed; right shoulder isometric    PT Home Exercise Plan  progress as needed    Consulted and Agree with Plan of Care  Patient       Patient will benefit from skilled therapeutic intervention in order to improve the following deficits and impairments:  Increased fascial restricitons, Pain, Decreased mobility, Increased muscle spasms, Decreased endurance, Decreased range of motion, Decreased strength  Visit Diagnosis: Chronic right shoulder pain - Plan: PT plan of care cert/re-cert  Stiffness of right shoulder, not elsewhere classified - Plan: PT plan of care cert/re-cert  Muscle weakness (generalized) - Plan: PT plan of care cert/re-cert  Other muscle spasm - Plan: PT plan of care cert/re-cert     Problem List Patient Active Problem List   Diagnosis Date Noted  . Fibroids 03/18/2015  . Menorrhagia 03/18/2015  . S/P hysterectomy 03/18/2015    Earlie Counts, PT 02/20/17 5:03 PM   [facility] [address], [place], [place], [address] Phone:  [phone]   Fax:  [phone]  Name: NAOMA BOXELL MRN: [id number] Date of Birth: Aug 24, 1975

## 2017-02-20 NOTE — Patient Instructions (Addendum)
Flexion (Assistive)    Clasp hands together and raise arms above head, keeping elbows as straight as possible. Can be done sitting or lying. Repeat _10__ times. Do _3___ sessions per day.  Copyright  VHI. All rights reserved.  ROM: Pendulum (Circular)    Let right arm move in circle clockwise, then counterclockwise, by rocking body weight in circular pattern. Circle __10__ times each direction per set. Do __1__ sets per session. Do __3__ sessions per day.  http://orth.exer.us/794   Copyright  VHI. All rights reserved.  Shoulder: Internal Rotation / Adduction    Position with feet flat. Place right hand behind back. Grasp wrist with opposite hand and gently pull arm further behind back. Keep head and back straight. Hold _15___ seconds. Repeat ___3_ times. Do __3__ sessions per day. CAUTION: Stretch slowly and gently. Copyright  VHI. All rights reserved.  Use ice or heat for 15 min.   Trigger Point Dry Needling  . What is Trigger Point Dry Needling (DN)? o DN is a physical therapy technique used to treat muscle pain and dysfunction. Specifically, DN helps deactivate muscle trigger points (muscle knots).  o A thin filiform needle is used to penetrate the skin and stimulate the underlying trigger point. The goal is for a local twitch response (LTR) to occur and for the trigger point to relax. No medication of any kind is injected during the procedure.   . What Does Trigger Point Dry Needling Feel Like?  o The procedure feels different for each individual patient. Some patients report that they do not actually feel the needle enter the skin and overall the process is not painful. Very mild bleeding may occur. However, many patients feel a deep cramping in the muscle in which the needle was inserted. This is the local twitch response.   Marland Kitchen How Will I feel after the treatment? o Soreness is normal, and the onset of soreness may not occur for a few hours. Typically this soreness does  not last longer than two days.  o Bruising is uncommon, however; ice can be used to decrease any possible bruising.  o In rare cases feeling tired or nauseous after the treatment is normal. In addition, your symptoms may get worse before they get better, this period will typically not last longer than 24 hours.   . What Can I do After My Treatment? o Increase your hydration by drinking more water for the next 24 hours. o You may place ice or heat on the areas treated that have become sore, however, do not use heat on inflamed or bruised areas. Heat often brings more relief post needling. o You can continue your regular activities, but vigorous activity is not recommended initially after the treatment for 24 hours. o DN is best combined with other physical therapy such as strengthening, stretching, and other therapies.    [facility] [address] [place], [place] [address] Phone # [phone] Fax [phone]

## 2017-02-21 ENCOUNTER — Ambulatory Visit (INDEPENDENT_AMBULATORY_CARE_PROVIDER_SITE_OTHER): Payer: BLUE CROSS/BLUE SHIELD | Admitting: Orthopaedic Surgery

## 2017-02-23 ENCOUNTER — Ambulatory Visit: Payer: BLUE CROSS/BLUE SHIELD | Admitting: Physical Therapy

## 2017-02-23 ENCOUNTER — Encounter: Payer: Self-pay | Admitting: Physical Therapy

## 2017-02-23 DIAGNOSIS — M25511 Pain in right shoulder: Principal | ICD-10-CM

## 2017-02-23 DIAGNOSIS — G8929 Other chronic pain: Secondary | ICD-10-CM

## 2017-02-23 DIAGNOSIS — M25611 Stiffness of right shoulder, not elsewhere classified: Secondary | ICD-10-CM

## 2017-02-23 DIAGNOSIS — M62838 Other muscle spasm: Secondary | ICD-10-CM

## 2017-02-23 DIAGNOSIS — M6281 Muscle weakness (generalized): Secondary | ICD-10-CM

## 2017-02-23 NOTE — Therapy (Signed)
[facility] [address] [place], [place], [address] Phone: [phone]   Fax:  [phone]  Physical Therapy Treatment  Patient Details  Name: Megan Wolf MRN: [id number] Date of Birth: [birth year] Referring Provider: Dr. Rodell Perna   Encounter Date: 02/23/2017  PT End of Session - 02/23/17 0944    Visit Number  2    Date for PT Re-Evaluation  04/17/17    Authorization Type  BCBS    PT Start Time  0845    PT Stop Time  0932    PT Time Calculation (min)  47 min    Activity Tolerance  Patient tolerated treatment well    Behavior During Therapy  [facility] for tasks assessed/performed       Past Medical History:  Diagnosis Date  . GERD (gastroesophageal reflux disease)   . IBS (irritable bowel syndrome)   . IC (interstitial cystitis)     Past Surgical History:  Procedure Laterality Date  . LIPOMA EXCISION    . ROBOTIC ASSISTED TOTAL HYSTERECTOMY WITH BILATERAL SALPINGO OOPHERECTOMY Bilateral 03/18/2015  . ROBOTIC ASSISTED TOTAL HYSTERECTOMY WITH SALPINGECTOMY Bilateral 03/18/2015   Procedure: ROBOTIC ASSISTED TOTAL HYSTERECTOMY WITH BILATERAL SALPINGO OOPHORECTOMY;  Surgeon: Christophe Louis, MD;  Location: [facility];  Service: Gynecology;  Laterality: Bilateral;    There were no vitals filed for this visit.  Subjective Assessment - 02/23/17 0848    Subjective  I was sore for several days after last visit.     Patient Stated Goals  Avoid surgery, reduce pain, improve strength    Currently in Pain?  Yes    Pain Score  3     Pain Location  Shoulder    Pain Orientation  Right    Pain Descriptors / Indicators  Aching;Sharp    Pain Type  Chronic pain    Pain Onset  More than a month ago    Pain Frequency  Intermittent    Aggravating Factors   pick something up with right arm, reach behind back, driving for a long period     Pain Relieving Factors  not using right arm    Multiple Pain Sites  No                       [facility] Adult PT Treatment/Exercise - 02/23/17 0001      Exercises   Exercises  Shoulder      Shoulder Exercises: Pulleys   Flexion  3 minutes    ABduction  3 minutes      Manual Therapy   Manual Therapy  Soft tissue mobilization;Scapular mobilization;Myofascial release;Passive ROM    Soft tissue mobilization  right shoulder RTC muscles, upper trap, levator scapula, upper trap, rhomboids, and lower trap    Myofascial Release  tissue rolling in posterior right shoulder and thoracic area    Scapular Mobilization  right scapula in prone and left sidely    Passive ROM  right shoulder in all directions       Trigger Point Dry Needling - 02/23/17 0940    Consent Given?  Yes    Education Handout Provided  Yes    Muscles Treated Upper Body  Rhomboids;Levator scapulae;Upper trapezius;Subscapularis right side    Upper Trapezius Response  Palpable increased muscle length;Twitch reponse elicited    Levator Scapulae Response  Twitch response elicited;Palpable increased muscle length    Rhomboids Response  Twitch response elicited;Palpable increased muscle length    Subscapularis Response  Palpable  increased muscle length           PT Education - 02/23/17 0943    Education provided  Yes    Education Details  stretch scapular area, and shoulder flexion    Person(s) Educated  Patient    Methods  Explanation;Demonstration;Verbal cues;Handout    Comprehension  Returned demonstration;Verbalized understanding       PT Short Term Goals - 02/20/17 1357      PT SHORT TERM GOAL #1   Title  independent with initial HEP    Time  4    Period  Weeks    Status  New    Target Date  03/20/17      PT SHORT TERM GOAL #2   Title  sleep on right shoulder with pain decreased >/= 25%    Time  4    Period  Weeks    Status  New    Target Date  03/20/17      PT SHORT TERM GOAL #3   Title  right shoulder flexion>./= 130 degrees  due to improve mobility of right  shoulder joint and scapula mobility    Time  4    Period  Weeks    Status  New    Target Date  03/20/17      PT SHORT TERM GOAL #4   Title  right shoulder abduction PROM >/= 130 degrees due to improved mobility of shoulder joint and scapula    Time  4    Period  Weeks    Status  New    Target Date  03/20/17      PT SHORT TERM GOAL #5   Title  right shoulder strength >/= 3/5 so she is able to do daily acitivties with greater ease    Time  4    Period  Weeks    Status  New    Target Date  03/20/17        PT Long Term Goals - 02/20/17 1605      PT LONG TERM GOAL #1   Title  independent HEP and understand how  to progress herself    Time  8    Period  Weeks    Status  New    Target Date  04/17/17      PT LONG TERM GOAL #2   Title  sleep on right side with pain decreased >/= 75%    Time  8    Period  Weeks    Status  New    Target Date  04/17/17      PT LONG TERM GOAL #3   Title  able to lift with right arm  her computer bag with </= 75% less pain due to right shoulder strength >/= 4/5    Time  8    Period  Weeks    Status  New    Target Date  04/17/17      PT LONG TERM GOAL #4   Title  able to reach under something with pain in right shoulder </= 75% due to improve right scapula mobility and strength    Time  8    Period  Weeks    Status  New    Target Date  04/17/17      PT LONG TERM GOAL #5   Title  able to reach up and outward to a high shelf due to increased right shoulder AROM >/= 145 degrees for flexion and abduction  Time  8    Period  Weeks    Status  New    Target Date  04/17/17            Plan - 02/23/17 0945    Clinical Impression Statement  Patient has decreased mobility of right shoulder and difficulty with right shoulder extension and internal rotation to lift the scapula.  Patient has myofascial pain in right shoulder and increased tension in right shoulder girdle.  Patient has increased mobility of right shoulder after therapy and had  increased mobility. Patient will benefit from skilled therapy to improve right shoulder ROM and strength while reducing her compensations and decrease pain.     Rehab Potential  Excellent    Clinical Impairments Affecting Rehab Potential  None    PT Frequency  2x / week    PT Duration  8 weeks    PT Next Visit Plan  dry needling to right , subscapularis, right rotator cuff; soft tissue work; joint mobilization to right rib cage, right shoulder and scapula modalities as needed; right shoulder isometric    PT Home Exercise Plan  progress as needed    Recommended Other Services  MD signed initial summary    Consulted and Agree with Plan of Care  Patient       Patient will benefit from skilled therapeutic intervention in order to improve the following deficits and impairments:  Increased fascial restricitons, Pain, Decreased mobility, Increased muscle spasms, Decreased endurance, Decreased range of motion, Decreased strength  Visit Diagnosis: Chronic right shoulder pain  Stiffness of right shoulder, not elsewhere classified  Muscle weakness (generalized)  Other muscle spasm     Problem List Patient Active Problem List   Diagnosis Date Noted  . Fibroids 03/18/2015  . Menorrhagia 03/18/2015  . S/P hysterectomy 03/18/2015    Earlie Counts, PT 02/23/17 9:49 AM   [facility] [address] [place], [place], [address] Phone: [phone]   Fax:  [phone]  Name: Megan Wolf MRN: [id number] Date of Birth: [birth year]

## 2017-03-02 ENCOUNTER — Encounter: Payer: Self-pay | Admitting: Physical Therapy

## 2017-03-02 ENCOUNTER — Ambulatory Visit: Payer: BLUE CROSS/BLUE SHIELD | Admitting: Physical Therapy

## 2017-03-02 DIAGNOSIS — M62838 Other muscle spasm: Secondary | ICD-10-CM

## 2017-03-02 DIAGNOSIS — M6281 Muscle weakness (generalized): Secondary | ICD-10-CM

## 2017-03-02 DIAGNOSIS — G8929 Other chronic pain: Secondary | ICD-10-CM

## 2017-03-02 DIAGNOSIS — M25511 Pain in right shoulder: Secondary | ICD-10-CM | POA: Diagnosis not present

## 2017-03-02 DIAGNOSIS — M25611 Stiffness of right shoulder, not elsewhere classified: Secondary | ICD-10-CM

## 2017-03-02 NOTE — Therapy (Signed)
[facility] [address], [place], [place], [address] Phone: [phone]   Fax:  [phone]  Physical Therapy Treatment  Patient Details  Name: Megan Wolf MRN: [id number] Date of Birth: 1975-03-29 Referring Provider: Dr. Rodell Perna   Encounter Date: 03/02/2017  PT End of Session - 03/02/17 1700    Visit Number  3    Date for PT Re-Evaluation  04/17/17    Authorization Type  BCBS    PT Start Time  1615    PT Stop Time  1655    PT Time Calculation (min)  40 min    Activity Tolerance  Patient tolerated treatment well    Behavior During Therapy  [facility] for tasks assessed/performed       Past Medical History:  Diagnosis Date  . GERD (gastroesophageal reflux disease)   . IBS (irritable bowel syndrome)   . IC (interstitial cystitis)     Past Surgical History:  Procedure Laterality Date  . LIPOMA EXCISION    . ROBOTIC ASSISTED TOTAL HYSTERECTOMY WITH BILATERAL SALPINGO OOPHERECTOMY Bilateral 03/18/2015  . ROBOTIC ASSISTED TOTAL HYSTERECTOMY WITH SALPINGECTOMY Bilateral 03/18/2015   Procedure: ROBOTIC ASSISTED TOTAL HYSTERECTOMY WITH BILATERAL SALPINGO OOPHORECTOMY;  Surgeon: Christophe Louis, MD;  Location: [facility];  Service: Gynecology;  Laterality: Bilateral;    There were no vitals filed for this visit.  Subjective Assessment - 03/02/17 1620    Subjective  After therapy I felt my shoulder wanted to come forward.  That lasted several days.  The dry needling decreased the tightness. I felt like I had more movement.     Patient Stated Goals  Avoid surgery, reduce pain, improve strength    Currently in Pain?  Yes    Pain Score  4     Pain Location  Shoulder    Pain Orientation  Right    Pain Descriptors / Indicators  Aching;Sharp    Pain Type  Chronic pain    Pain Onset  More than a month ago    Pain Frequency  Intermittent    Aggravating Factors   pick something up with right arm, reach behind back, driving  for a long period    Pain Relieving Factors  not using right arm    Multiple Pain Sites  No         [facility] PT Assessment - 03/02/17 0001      AROM   Right Shoulder Flexion  155 Degrees    Right Shoulder ABduction  160 Degrees      Strength   Right Shoulder Flexion  3/5    Right Shoulder ABduction  3/5                  [facility] Adult PT Treatment/Exercise - 03/02/17 0001      Shoulder Exercises: Sidelying   ABduction  Strengthening;Right;20 reps    Other Sidelying Exercises  scapula isometrics for all directions       Shoulder Exercises: Pulleys   Flexion  3 minutes    ABduction  3 minutes    Other Pulley Exercises  IR with towel roll under right axilla      Modalities   Modalities  Iontophoresis      Iontophoresis   Type of Iontophoresis  Dexamethasone    Location  anterior right shoulder    Dose  1 ml lot #[id number]    Time  6 hour patch #1      Manual Therapy   Manual Therapy  Soft tissue mobilization;Joint mobilization    Joint Mobilization  right lateral rib cage to open up    Soft tissue mobilization  right shoulder RTC muscles, upper trap, levator scapula, upper trap, rhomboids, and lower trap    Scapular Mobilization  right scapula in all directions             PT Education - 03/02/17 1657    Education provided  Yes    Education Details  information on iontophoresis; right shoulder strength exercises    Person(s) Educated  Patient    Methods  Explanation;Demonstration;Verbal cues;Handout    Comprehension  Verbalized understanding;Returned demonstration       PT Short Term Goals - 03/02/17 1703      PT SHORT TERM GOAL #1   Title  independent with initial HEP    Time  4    Period  Weeks    Status  Achieved      PT SHORT TERM GOAL #2   Title  sleep on right shoulder with pain decreased >/= 25%    Time  4    Period  Weeks    Status  On-going      PT SHORT TERM GOAL #3   Title  right shoulder flexion>./= 130 degrees  due to improve  mobility of right shoulder joint and scapula mobility    Time  4    Period  Weeks    Status  Achieved      PT SHORT TERM GOAL #4   Title  right shoulder abduction PROM >/= 130 degrees due to improved mobility of shoulder joint and scapula    Time  4    Period  Weeks    Status  Achieved      PT SHORT TERM GOAL #5   Title  right shoulder strength >/= 3/5 so she is able to do daily acitivties with greater ease    Time  4    Period  Weeks    Status  Achieved        PT Long Term Goals - 02/20/17 1605      PT LONG TERM GOAL #1   Title  independent HEP and understand how  to progress herself    Time  8    Period  Weeks    Status  New    Target Date  04/17/17      PT LONG TERM GOAL #2   Title  sleep on right side with pain decreased >/= 75%    Time  8    Period  Weeks    Status  New    Target Date  04/17/17      PT LONG TERM GOAL #3   Title  able to lift with right arm  her computer bag with </= 75% less pain due to right shoulder strength >/= 4/5    Time  8    Period  Weeks    Status  New    Target Date  04/17/17      PT LONG TERM GOAL #4   Title  able to reach under something with pain in right shoulder </= 75% due to improve right scapula mobility and strength    Time  8    Period  Weeks    Status  New    Target Date  04/17/17      PT LONG TERM GOAL #5   Title  able to reach up and outward to a high shelf due to  increased right shoulder AROM >/= 145 degrees for flexion and abduction    Time  8    Period  Weeks    Status  New    Target Date  04/17/17            Plan - 03/02/17 1700    Clinical Impression Statement  Patient had full right shoulder AROM and has increased right shoulder strength.  Patient has increased mobility of the right scapula and rib cage.  Patient is trying iontophoresis for pain this visit.  Patient will benefit from skilled therapy to improve right shoulder ROM and strength while reducing her compensations and decrease pain.     Rehab  Potential  Excellent    Clinical Impairments Affecting Rehab Potential  None    PT Frequency  2x / week    PT Duration  8 weeks    PT Treatment/Interventions  Cryotherapy;Electrical Stimulation;Iontophoresis 4mg /ml Dexamethasone;Moist Heat;Ultrasound;Therapeutic exercise;Therapeutic activities;Neuromuscular re-education;Patient/family education;Passive range of motion;Manual techniques;Dry needling;Taping    PT Next Visit Plan  dry needling to right , subscapularis, right rotator cuff; soft tissue work; joint mobilization to right rib cage, right shoulder and scapula modalities as needed; ube; see how ionto did, UBE    PT Home Exercise Plan  progress as needed    Consulted and Agree with Plan of Care  Patient       Patient will benefit from skilled therapeutic intervention in order to improve the following deficits and impairments:  Increased fascial restricitons, Pain, Decreased mobility, Increased muscle spasms, Decreased endurance, Decreased range of motion, Decreased strength  Visit Diagnosis: Chronic right shoulder pain  Stiffness of right shoulder, not elsewhere classified  Muscle weakness (generalized)  Other muscle spasm     Problem List Patient Active Problem List   Diagnosis Date Noted  . Fibroids 03/18/2015  . Menorrhagia 03/18/2015  . S/P hysterectomy 03/18/2015    Earlie Counts, PT 03/02/17 5:04 PM   [facility] [address], [place], [place], [address] Phone: [phone]   Fax:  [phone]  Name: MITTIE KNITTEL MRN: [id number] Date of Birth: May 21, 1975

## 2017-03-02 NOTE — Patient Instructions (Addendum)
Strengthening: Resisted Internal Rotation    Hold tubing in right hand, elbow at side and forearm out. Rotate forearm in across body. Repeat 20___ times per set. Do __1__ sets per session. Do _1___ sessions per day.  http://orth.exer.us/830   Copyright  VHI. All rights reserved.    Lie on your side with your elbow bent to 90 degrees. Place a rolled up towel between your arm and the side your body as shown.   Squeeze your shoulder blade back and down toward your buttocks and hold that position.   Next, roll your arm upwards from your stomach area towards the ceiling while maintaining your arm against the towel and with your shoulder blade held down and back the entire time. Lower your arm and repeat.  15 times 1 time per day.     While lying on your side and arm at your side, slowly raise up the arm towards overhead and away from your side 15 times 1 time per day  Punch forward with yellow band 20 times 1 time per day.   IONTOPHORESIS PATIENT PRECAUTIONS & CONTRAINDICATIONS:  . Redness under one or both electrodes can occur.  This characterized by a uniform redness that usually disappears within 12 hours of treatment. . Small pinhead size blisters may result in response to the drug.  Contact your physician if the problem persists more than 24 hours. . On rare occasions, iontophoresis therapy can result in temporary skin reactions such as rash, inflammation, irritation or burns.  The skin reactions may be the result of individual sensitivity to the ionic solution used, the condition of the skin at the start of treatment, reaction to the materials in the electrodes, allergies or sensitivity to dexamethasone, or a poor connection between the patch and your skin.  Discontinue using iontophoresis if you have any of these reactions and report to your therapist. . Remove the Patch or electrodes if you have any undue sensation of pain or burning during the treatment and report discomfort to  your therapist. . Tell your Therapist if you have had known adverse reactions to the application of electrical current. . If using the Patch, the LED light will turn off when treatment is complete and the patch can be removed.  Approximate treatment time is 1-3 hours.  Remove the patch when light goes off or after 6 hours. . The Patch can be worn during normal activity, however excessive motion where the electrodes have been placed can cause poor contact between the skin and the electrode or uneven electrical current resulting in greater risk of skin irritation. Marland Kitchen Keep out of the reach of children.   . DO NOT use if you have a cardiac pacemaker or any other electrically sensitive implanted device. . DO NOT use if you have a known sensitivity to dexamethasone. . DO NOT use during Magnetic Resonance Imaging (MRI). . DO NOT use over broken or compromised skin (e.g. sunburn, cuts, or acne) due to the increased risk of skin reaction. . DO NOT SHAVE over the area to be treated:  To establish good contact between the Patch and the skin, excessive hair may be clipped. . DO NOT place the Patch or electrodes on or over your eyes, directly over your heart, or brain. . DO NOT reuse the Patch or electrodes as this may cause burns to occur. EXTENSION: Standing - Resistance Band: Stable (Active)   Stand, right arm at side. Against yellow resistance band, draw arm backward, as far as possible, keeping elbow  straight. Complete __1_ sets of _20__ repetitions. Perform __1_ sessions per day. Red band Copyright  VHI. All rights reserved.   http://ss.exer.us/290   Copyright  VHI. All rights reserved.  Resistive Band Rowing   With resistive band anchored in door, grasp both ends. Keeping elbows bent, pull back, squeezing shoulder blades together. Hold ___1_ seconds. Repeat _30___ times. Do _1___ sessions per day. Red band  http://gt2.exer.us/97   Copyright  VHI. All rights reserved.   [facility] [address] [place], [place] [address] Phone # [phone] Fax [phone]

## 2017-03-10 ENCOUNTER — Ambulatory Visit: Payer: BLUE CROSS/BLUE SHIELD | Attending: Orthopaedic Surgery | Admitting: Physical Therapy

## 2017-03-10 ENCOUNTER — Encounter: Payer: Self-pay | Admitting: Physical Therapy

## 2017-03-10 DIAGNOSIS — M6281 Muscle weakness (generalized): Secondary | ICD-10-CM | POA: Insufficient documentation

## 2017-03-10 DIAGNOSIS — M62838 Other muscle spasm: Secondary | ICD-10-CM | POA: Diagnosis present

## 2017-03-10 DIAGNOSIS — M25611 Stiffness of right shoulder, not elsewhere classified: Secondary | ICD-10-CM | POA: Diagnosis present

## 2017-03-10 DIAGNOSIS — M25511 Pain in right shoulder: Secondary | ICD-10-CM | POA: Diagnosis not present

## 2017-03-10 DIAGNOSIS — G8929 Other chronic pain: Secondary | ICD-10-CM | POA: Diagnosis present

## 2017-03-10 NOTE — Patient Instructions (Signed)
Strengthening: Isometric Flexion  Using wall for resistance, press right fist into ball using light pressure. Hold _3___ seconds. Repeat ___5_ times per set. Do __1__ sets per session. Do __2__ sessions per day.  SHOULDER: Abduction (Isometric)  Use wall as resistance. Press arm against pillow. Keep elbow straight. Hold _3__ seconds. _5__ reps per set, __2_ sets per day  Extension (Isometric)  Place left bent elbow and back of arm against wall. Press elbow against wall. Hold _3___ seconds. Repeat _5___ times. Do __2__ sessions per day.  Internal Rotation (Isometric)  Place palm of right fist against door frame, with elbow bent. Press fist against door frame. Hold _3__ seconds. Repeat __5__ times. Do ___2_ sessions per day.  External Rotation (Isometric)  Place back of left fist against door frame, with elbow bent. Press fist against door frame. Hold ___3_ seconds. Repeat _5___ times. Do ___2_ sessions per day.  Copyright  VHI. All rights reserved.

## 2017-03-10 NOTE — Therapy (Signed)
[facility] [address] [place], [place], [address] Phone: [phone]   Fax:  [phone]  Physical Therapy Treatment  Patient Details  Name: Megan Wolf MRN: [id number] Date of Birth: [birth year] Referring Provider: Dr. Rodell Perna   Encounter Date: 03/10/2017  PT End of Session - 03/10/17 0805    Visit Number  4    Date for PT Re-Evaluation  04/17/17    Authorization Type  BCBS    PT Start Time  0803    PT Stop Time  0900    PT Time Calculation (min)  57 min    Activity Tolerance  Patient tolerated treatment well    Behavior During Therapy  [facility] for tasks assessed/performed       Past Medical History:  Diagnosis Date  . GERD (gastroesophageal reflux disease)   . IBS (irritable bowel syndrome)   . IC (interstitial cystitis)     Past Surgical History:  Procedure Laterality Date  . LIPOMA EXCISION    . ROBOTIC ASSISTED TOTAL HYSTERECTOMY WITH BILATERAL SALPINGO OOPHERECTOMY Bilateral 03/18/2015  . ROBOTIC ASSISTED TOTAL HYSTERECTOMY WITH SALPINGECTOMY Bilateral 03/18/2015   Procedure: ROBOTIC ASSISTED TOTAL HYSTERECTOMY WITH BILATERAL SALPINGO OOPHORECTOMY;  Surgeon: Christophe Louis, MD;  Location: [facility];  Service: Gynecology;  Laterality: Bilateral;    There were no vitals filed for this visit.  Subjective Assessment - 03/10/17 0807    Subjective  Sore this AM.    Currently in Pain?  Yes    Pain Score  5     Pain Location  Shoulder    Pain Orientation  Right    Aggravating Factors   Sleeping on it    Pain Relieving Factors  resting it    Multiple Pain Sites  No                      [facility] Adult PT Treatment/Exercise - 03/10/17 0001      Self-Care   Self-Care  Other Self-Care Comments Done whil ept was on arm bike    Other Self-Care Comments   what motions to avoid anteriot translation of humerus. Pt verbally understood.      Shoulder Exercises: ROM/Strengthening   UBE (Upper  Arm Bike)  L1 3x 3  PTA present for status update      Shoulder Exercises: Isometric Strengthening   Flexion  3X5"    Extension  3X5"    External Rotation  3X5"    Internal Rotation  3X5"    ABduction  3X5"    ADduction  3X5"      Electrical Stimulation   Electrical Stimulation Location  Rt shoulder    Electrical Stimulation Action  IFC    Electrical Stimulation Parameters  80-150 HZ seated with arm support    Electrical Stimulation Goals  Pain      Iontophoresis   Type of Iontophoresis  Dexamethasone    Location  anterior right shoulder    Dose  1 ml lot #[id number]    Time  6 hour patch #1      Manual Therapy   Myofascial Release  Instrument asst downgrade to RT lats and post RT shoulder, upgrade to RT RTC             PT Education - 03/10/17 0820    Education provided  Yes    Education Details  isometric HEP    Person(s) Educated  Patient    Methods  Explanation;Demonstration;Tactile cues;Verbal cues;Handout  Comprehension  Verbalized understanding;Returned demonstration       PT Short Term Goals - 03/02/17 1703      PT SHORT TERM GOAL #1   Title  independent with initial HEP    Time  4    Period  Weeks    Status  Achieved      PT SHORT TERM GOAL #2   Title  sleep on right shoulder with pain decreased >/= 25%    Time  4    Period  Weeks    Status  On-going      PT SHORT TERM GOAL #3   Title  right shoulder flexion>./= 130 degrees  due to improve mobility of right shoulder joint and scapula mobility    Time  4    Period  Weeks    Status  Achieved      PT SHORT TERM GOAL #4   Title  right shoulder abduction PROM >/= 130 degrees due to improved mobility of shoulder joint and scapula    Time  4    Period  Weeks    Status  Achieved      PT SHORT TERM GOAL #5   Title  right shoulder strength >/= 3/5 so she is able to do daily acitivties with greater ease    Time  4    Period  Weeks    Status  Achieved        PT Long Term Goals - 02/20/17 1605       PT LONG TERM GOAL #1   Title  independent HEP and understand how  to progress herself    Time  8    Period  Weeks    Status  New    Target Date  04/17/17      PT LONG TERM GOAL #2   Title  sleep on right side with pain decreased >/= 75%    Time  8    Period  Weeks    Status  New    Target Date  04/17/17      PT LONG TERM GOAL #3   Title  able to lift with right arm  her computer bag with </= 75% less pain due to right shoulder strength >/= 4/5    Time  8    Period  Weeks    Status  New    Target Date  04/17/17      PT LONG TERM GOAL #4   Title  able to reach under something with pain in right shoulder </= 75% due to improve right scapula mobility and strength    Time  8    Period  Weeks    Status  New    Target Date  04/17/17      PT LONG TERM GOAL #5   Title  able to reach up and outward to a high shelf due to increased right shoulder AROM >/= 145 degrees for flexion and abduction    Time  8    Period  Weeks    Status  New    Target Date  04/17/17            Plan - 03/10/17 0806    Clinical Impression Statement  Pt was pretty sore coming to session today after doing her hair yesterday which took some time. She was given isometrics for HEP. The isometrics aggrevated the pain some so we used Estim to help reduce the pain after the session followed by her second  ionto patch. She feels the patch was helpfull in reducing her pain . Pt was also educated in positions that strain the anterior shoulder and might be good to avoid for the time being.     Rehab Potential  Excellent    Clinical Impairments Affecting Rehab Potential  None    PT Frequency  2x / week    PT Duration  8 weeks    PT Treatment/Interventions  Cryotherapy;Electrical Stimulation;Iontophoresis 4mg /ml Dexamethasone;Moist Heat;Ultrasound;Therapeutic exercise;Therapeutic activities;Neuromuscular re-education;Patient/family education;Passive range of motion;Manual techniques;Dry needling;Taping    PT Next  Visit Plan  DN if seen by PT, see if pt tolerating the isometrics. If she is, she has some light weights at home and would like to learn some exercises she can do at home with them. Ionto #3.    Consulted and Agree with Plan of Care  Patient       Patient will benefit from skilled therapeutic intervention in order to improve the following deficits and impairments:  Increased fascial restricitons, Pain, Decreased mobility, Increased muscle spasms, Decreased endurance, Decreased range of motion, Decreased strength  Visit Diagnosis: Chronic right shoulder pain  Stiffness of right shoulder, not elsewhere classified  Muscle weakness (generalized)  Other muscle spasm     Problem List Patient Active Problem List   Diagnosis Date Noted  . Fibroids 03/18/2015  . Menorrhagia 03/18/2015  . S/P hysterectomy 03/18/2015    Benjie Ricketson , PTA 03/10/2017, 9:10 AM  [facility] [address], [place], [place], [address] Phone: [phone]   Fax:  [phone]  Name: IVYROSE HASHMAN MRN: [id number] Date of Birth: Aug 24, 1975

## 2017-03-10 NOTE — Therapy (Signed)
[facility] [address] [place], [place], [address] Phone: [phone]   Fax:  [phone]  Physical Therapy Treatment  Patient Details  Name: Megan Wolf MRN: [id number] Date of Birth: 22-Jul-1975 Referring Provider: Dr. Rodell Perna   Encounter Date: 03/10/2017  PT End of Session - 03/10/17 0805    Visit Number  4    Date for PT Re-Evaluation  04/17/17    Authorization Type  BCBS    PT Start Time  0803    PT Stop Time  0900    PT Time Calculation (min)  57 min    Activity Tolerance  Patient tolerated treatment well    Behavior During Therapy  [facility] for tasks assessed/performed       Past Medical History:  Diagnosis Date  . GERD (gastroesophageal reflux disease)   . IBS (irritable bowel syndrome)   . IC (interstitial cystitis)     Past Surgical History:  Procedure Laterality Date  . LIPOMA EXCISION    . ROBOTIC ASSISTED TOTAL HYSTERECTOMY WITH BILATERAL SALPINGO OOPHERECTOMY Bilateral 03/18/2015  . ROBOTIC ASSISTED TOTAL HYSTERECTOMY WITH SALPINGECTOMY Bilateral 03/18/2015   Procedure: ROBOTIC ASSISTED TOTAL HYSTERECTOMY WITH BILATERAL SALPINGO OOPHORECTOMY;  Surgeon: Christophe Louis, MD;  Location: [place] [facility];  Service: Gynecology;  Laterality: Bilateral;    There were no vitals filed for this visit.  Subjective Assessment - 03/10/17 0807    Subjective  Sore this AM.    Currently in Pain?  Yes    Pain Score  5     Pain Location  Shoulder    Pain Orientation  Right    Aggravating Factors   Sleeping on it    Pain Relieving Factors  resting it    Multiple Pain Sites  No                      [facility] Adult PT Treatment/Exercise - 03/10/17 0001      Self-Care   Self-Care  Other Self-Care Comments    Other Self-Care Comments   what motions to avoid anteriot translation of humerus. Pt verbally understood.      Shoulder Exercises: ROM/Strengthening   UBE (Upper Arm Bike)  L1 3x 3  PTA present  for status update      Shoulder Exercises: Isometric Strengthening   Flexion  3X5"    Extension  3X5"    External Rotation  3X5"    Internal Rotation  3X5"    ABduction  3X5"    ADduction  3X5"      Electrical Stimulation   Electrical Stimulation Location  Rt shoulder    Electrical Stimulation Action  IFC    Electrical Stimulation Parameters  80-150 HZ seated with arm support    Electrical Stimulation Goals  Pain      Iontophoresis   Type of Iontophoresis  Dexamethasone    Location  anterior right shoulder    Dose  1 ml lot #[id number]    Time  6 hour patch #1      Manual Therapy   Myofascial Release  Instrument asst downgrade to RT lats and post RT shoulder, upgrade to RT RTC             PT Education - 03/10/17 0820    Education provided  Yes    Education Details  isometric HEP    Person(s) Educated  Patient    Methods  Explanation;Demonstration;Tactile cues;Verbal cues;Handout    Comprehension  Verbalized understanding;Returned  demonstration       PT Short Term Goals - 03/02/17 1703      PT SHORT TERM GOAL #1   Title  independent with initial HEP    Time  4    Period  Weeks    Status  Achieved      PT SHORT TERM GOAL #2   Title  sleep on right shoulder with pain decreased >/= 25%    Time  4    Period  Weeks    Status  On-going      PT SHORT TERM GOAL #3   Title  right shoulder flexion>./= 130 degrees  due to improve mobility of right shoulder joint and scapula mobility    Time  4    Period  Weeks    Status  Achieved      PT SHORT TERM GOAL #4   Title  right shoulder abduction PROM >/= 130 degrees due to improved mobility of shoulder joint and scapula    Time  4    Period  Weeks    Status  Achieved      PT SHORT TERM GOAL #5   Title  right shoulder strength >/= 3/5 so she is able to do daily acitivties with greater ease    Time  4    Period  Weeks    Status  Achieved        PT Long Term Goals - 02/20/17 1605      PT LONG TERM GOAL #1    Title  independent HEP and understand how  to progress herself    Time  8    Period  Weeks    Status  New    Target Date  04/17/17      PT LONG TERM GOAL #2   Title  sleep on right side with pain decreased >/= 75%    Time  8    Period  Weeks    Status  New    Target Date  04/17/17      PT LONG TERM GOAL #3   Title  able to lift with right arm  her computer bag with </= 75% less pain due to right shoulder strength >/= 4/5    Time  8    Period  Weeks    Status  New    Target Date  04/17/17      PT LONG TERM GOAL #4   Title  able to reach under something with pain in right shoulder </= 75% due to improve right scapula mobility and strength    Time  8    Period  Weeks    Status  New    Target Date  04/17/17      PT LONG TERM GOAL #5   Title  able to reach up and outward to a high shelf due to increased right shoulder AROM >/= 145 degrees for flexion and abduction    Time  8    Period  Weeks    Status  New    Target Date  04/17/17            Plan - 03/10/17 0806    Clinical Impression Statement  Pt was pretty sore coming to session today after doing her hair yesterday which took some time. She was given isometrics for HEP. The isometrics aggrevated the pain some so we used Estim to help reduce the pain after the session followed by her second ionto patch. She feels  the patch was helpfull in reducing her pain . Pt was also educated in positions that stain the anterior shoulder and might be good to avoid for the time being.     Rehab Potential  Excellent    Clinical Impairments Affecting Rehab Potential  None    PT Frequency  2x / week    PT Duration  8 weeks    PT Treatment/Interventions  Cryotherapy;Electrical Stimulation;Iontophoresis 4mg /ml Dexamethasone;Moist Heat;Ultrasound;Therapeutic exercise;Therapeutic activities;Neuromuscular re-education;Patient/family education;Passive range of motion;Manual techniques;Dry needling;Taping    Consulted and Agree with Plan of Care   Patient       Patient will benefit from skilled therapeutic intervention in order to improve the following deficits and impairments:  Increased fascial restricitons, Pain, Decreased mobility, Increased muscle spasms, Decreased endurance, Decreased range of motion, Decreased strength  Visit Diagnosis: Chronic right shoulder pain  Stiffness of right shoulder, not elsewhere classified  Muscle weakness (generalized)  Other muscle spasm     Problem List Patient Active Problem List   Diagnosis Date Noted  . Fibroids 03/18/2015  . Menorrhagia 03/18/2015  . S/P hysterectomy 03/18/2015    Latoya Diskin, PTA 03/10/2017, 8:47 AM  [facility] [address] [place], [place], [address] Phone: [phone]   Fax:  [phone]  Name: Megan Wolf MRN: [id number] Date of Birth: 05/23/1975

## 2017-03-13 ENCOUNTER — Ambulatory Visit: Payer: BLUE CROSS/BLUE SHIELD | Admitting: Physical Therapy

## 2017-03-17 ENCOUNTER — Ambulatory Visit: Payer: BLUE CROSS/BLUE SHIELD | Admitting: Physical Therapy

## 2017-03-17 ENCOUNTER — Encounter: Payer: Self-pay | Admitting: Physical Therapy

## 2017-03-17 DIAGNOSIS — M25511 Pain in right shoulder: Secondary | ICD-10-CM | POA: Diagnosis not present

## 2017-03-17 DIAGNOSIS — M25611 Stiffness of right shoulder, not elsewhere classified: Secondary | ICD-10-CM

## 2017-03-17 DIAGNOSIS — M6281 Muscle weakness (generalized): Secondary | ICD-10-CM

## 2017-03-17 DIAGNOSIS — M62838 Other muscle spasm: Secondary | ICD-10-CM

## 2017-03-17 DIAGNOSIS — G8929 Other chronic pain: Secondary | ICD-10-CM

## 2017-03-17 NOTE — Patient Instructions (Signed)
For your shoulder strengthening exercises:  * To start each exercise, gently settle the shoulder blade back and down which should help pull the front of your shoulder back. Try to keep this "connection" at the shoulder blade as you move your arm with the exercises.  * No pain. If it hurts try less weight or smaller range of motion.   *Yellow band in the door: Punching forward first. Band is behind you, and punch your arm forward slowly 10x. Build to 2x10 Turn around and bend the elbow back, like you are "sawing." So you are bending the elbow again, just pulling backwards this time. 10x, building to 2x10.    * Using light weights or can or soup  Standing arm raise in front of you ( 12:00 ). Lift no higher than 90 degrees, elbow can be straight or slightly bent.   Standing arm raise to 45 degrees ( 2:00 ). Lift no higher than 90 degrees, elbow can be straight or slightly bent.   2x10 build to more reps 15-20 THEN work on a little heavier if that does not increase any pain.

## 2017-03-17 NOTE — Therapy (Addendum)
[facility] [address] [place], [place], [address] Phone: [phone]   Fax:  [phone]  Physical Therapy Treatment  Patient Details  Name: Megan Wolf MRN: [id number] Date of Birth: 08-Apr-1975 Referring Provider: Dr. Rodell Perna   Encounter Date: 03/17/2017  PT End of Session - 03/17/17 0808    Visit Number  5    Date for PT Re-Evaluation  04/17/17    Authorization Type  BCBS    PT Start Time  0806    PT Stop Time  0857    PT Time Calculation (min)  51 min    Activity Tolerance  Patient tolerated treatment well    Behavior During Therapy  [facility] for tasks assessed/performed       Past Medical History:  Diagnosis Date  . GERD (gastroesophageal reflux disease)   . IBS (irritable bowel syndrome)   . IC (interstitial cystitis)     Past Surgical History:  Procedure Laterality Date  . LIPOMA EXCISION    . ROBOTIC ASSISTED TOTAL HYSTERECTOMY WITH BILATERAL SALPINGO OOPHERECTOMY Bilateral 03/18/2015  . ROBOTIC ASSISTED TOTAL HYSTERECTOMY WITH SALPINGECTOMY Bilateral 03/18/2015   Procedure: ROBOTIC ASSISTED TOTAL HYSTERECTOMY WITH BILATERAL SALPINGO OOPHORECTOMY;  Surgeon: Christophe Louis, MD;  Location: [place] [facility];  Service: Gynecology;  Laterality: Bilateral;    There were no vitals filed for this visit.  Subjective Assessment - 03/17/17 0810    Subjective  The Estim was amazing. Pt reports little to no pain for 3 days after last session. Ionto also helpful, mostly likely the combo.     Currently in Pain?  Yes    Pain Score  2     Pain Location  Shoulder    Pain Orientation  Right    Pain Descriptors / Indicators  Dull    Aggravating Factors   First thing in the AM    Pain Relieving Factors  Estim, ionto, appropriate rest    Multiple Pain Sites  No                      [facility] Adult PT Treatment/Exercise - 03/17/17 0001      Shoulder Exercises: ROM/Strengthening   UBE (Upper Arm Bike)  L1  3x 3  PTA present for status update      Shoulder Exercises: Isometric Strengthening   Flexion  5X5"    ABduction  5X5"    ADduction  5X5"      Electrical Stimulation   Electrical Stimulation Location  Rt shoulder    Electrical Stimulation Action  IFC    Electrical Stimulation Parameters  80-150 HZ seated with support    Electrical Stimulation Goals  Pain      Iontophoresis   Type of Iontophoresis  Dexamethasone #3    Location  anterior right shoulder    Dose  1 ml skin intact,     Time  6 hour patch Pt verbalizes understanding of wear time             PT Education - 03/17/17 0815    Education provided  Yes    Education Details  Where to buy TENS unit, HEP for shoulder strength icluding light wts and yellow tband    Person(s) Educated  Patient    Methods  Explanation;Demonstration;Tactile cues;Verbal cues;Handout    Comprehension  Verbalized understanding;Returned demonstration       PT Short Term Goals - 03/02/17 1703      PT SHORT TERM GOAL #1  Title  independent with initial HEP    Time  4    Period  Weeks    Status  Achieved      PT SHORT TERM GOAL #2   Title  sleep on right shoulder with pain decreased >/= 25%    Time  4    Period  Weeks    Status  On-going      PT SHORT TERM GOAL #3   Title  right shoulder flexion>./= 130 degrees  due to improve mobility of right shoulder joint and scapula mobility    Time  4    Period  Weeks    Status  Achieved      PT SHORT TERM GOAL #4   Title  right shoulder abduction PROM >/= 130 degrees due to improved mobility of shoulder joint and scapula    Time  4    Period  Weeks    Status  Achieved      PT SHORT TERM GOAL #5   Title  right shoulder strength >/= 3/5 so she is able to do daily acitivties with greater ease    Time  4    Period  Weeks    Status  Achieved        PT Long Term Goals - 02/20/17 1605      PT LONG TERM GOAL #1   Title  independent HEP and understand how  to progress herself    Time   8    Period  Weeks    Status  New    Target Date  04/17/17      PT LONG TERM GOAL #2   Title  sleep on right side with pain decreased >/= 75%    Time  8    Period  Weeks    Status  New    Target Date  04/17/17      PT LONG TERM GOAL #3   Title  able to lift with right arm  her computer bag with </= 75% less pain due to right shoulder strength >/= 4/5    Time  8    Period  Weeks    Status  New    Target Date  04/17/17      PT LONG TERM GOAL #4   Title  able to reach under something with pain in right shoulder </= 75% due to improve right scapula mobility and strength    Time  8    Period  Weeks    Status  New    Target Date  04/17/17      PT LONG TERM GOAL #5   Title  able to reach up and outward to a high shelf due to increased right shoulder AROM >/= 145 degrees for flexion and abduction    Time  8    Period  Weeks    Status  New    Target Date  04/17/17            Plan - 03/17/17 0809    Clinical Impression Statement  Pt doing well with her isometrics that we progressed to light PREs today for deltoid strength under 90 degrees and within pain free ROM. Pt gets days of pain relief after Estim so she was encouraged to look on [place] and consider purchasing one. Ionto seems to be effective in combination with other treatments. Pain since eval is 25%-30% improved., making slow but steady progress towards long term goals.     Rehab Potential  Excellent    Clinical Impairments Affecting Rehab Potential  None    PT Frequency  2x / week    PT Duration  8 weeks    PT Next Visit Plan  See how pt is doing with her HEP involing the weights. See if any modifications need to be done, if doing ok add IR/ER to HEP. Continue with ionto #4 and Estim.     Consulted and Agree with Plan of Care  --       Patient will benefit from skilled therapeutic intervention in order to improve the following deficits and impairments:  Increased fascial restricitons, Pain, Decreased mobility,  Increased muscle spasms, Decreased endurance, Decreased range of motion, Decreased strength  Visit Diagnosis: Chronic right shoulder pain  Stiffness of right shoulder, not elsewhere classified  Muscle weakness (generalized)  Other muscle spasm     Problem List Patient Active Problem List   Diagnosis Date Noted  . Fibroids 03/18/2015  . Menorrhagia 03/18/2015  . S/P hysterectomy 03/18/2015    Katessa Attridge, PTA 03/17/2017, 8:59 AM   [facility] [address] [place], [place], [address] Phone: [phone]   Fax:  [phone]  Name: Megan Wolf MRN: [id number] Date of Birth: Oct 04, 1975 PHYSICAL THERAPY DISCHARGE SUMMARY  Visits from Start of Care: 5  Current functional level related to goals / functional outcomes: See above. Patient cancelled her appointments due to looking into insurance coverage.  Patient wants to so therapy at home due to finances.    Remaining deficits: See above.    Education / Equipment: HEP Plan: Patient agrees to discharge.  Patient goals were not met. Patient is being discharged due to financial reasons.  Thank you for the referral. Earlie Counts, PT 04/21/17 9:27 AM  ?????

## 2017-03-20 ENCOUNTER — Encounter: Payer: BLUE CROSS/BLUE SHIELD | Admitting: Physical Therapy

## 2017-03-24 ENCOUNTER — Encounter: Payer: BLUE CROSS/BLUE SHIELD | Admitting: Physical Therapy

## 2017-03-24 ENCOUNTER — Ambulatory Visit (INDEPENDENT_AMBULATORY_CARE_PROVIDER_SITE_OTHER): Payer: BLUE CROSS/BLUE SHIELD | Admitting: Orthopaedic Surgery

## 2017-03-28 ENCOUNTER — Encounter: Payer: Self-pay | Admitting: Physical Therapy

## 2017-03-31 ENCOUNTER — Encounter: Payer: Self-pay | Admitting: Physical Therapy

## 2017-04-11 ENCOUNTER — Ambulatory Visit (INDEPENDENT_AMBULATORY_CARE_PROVIDER_SITE_OTHER): Payer: BLUE CROSS/BLUE SHIELD | Admitting: Orthopaedic Surgery

## 2017-04-11 ENCOUNTER — Encounter (INDEPENDENT_AMBULATORY_CARE_PROVIDER_SITE_OTHER): Payer: Self-pay | Admitting: Orthopaedic Surgery

## 2017-04-11 VITALS — BP 107/75 | HR 68 | Ht 65.0 in | Wt 178.0 lb

## 2017-04-11 DIAGNOSIS — M25311 Other instability, right shoulder: Secondary | ICD-10-CM

## 2017-04-11 NOTE — Progress Notes (Signed)
Office Visit Note   Patient: Megan Wolf           Date of Birth: 06/09/1975           MRN: [id number] Visit Date: 04/11/2017              Requested by: Jonathon Jordan, MD [address] [place], [place] [address] PCP: Jonathon Jordan, MD   Assessment & Plan: Visit Diagnoses:  1. Instability of right shoulder joint     Plan: We discussed complex nature of the patient's problem.  She has a small Hill-Sachs lesion some fraying in the anterior labrum and inferior labral spurring.  We will have her see Dr. Alphonzo Severance for evaluation and recommendations.  Her out-of-pocket cost for therapy was substantial and she can continue her exercises on her own at home.  We discussed some of these exercises she could do.  Patient is right-hand dominant this bothers her on a daily basis she has to be careful otherwise she feels that her shoulder subluxes with activity.  Her symptoms have gotten worse over the last few years.  Follow-Up Instructions: No Follow-up on file.   Orders:  No orders of the defined types were placed in this encounter.  No orders of the defined types were placed in this encounter.     Procedures: No procedures performed   Clinical Data: No additional findings.   Subjective: Chief Complaint  Patient presents with  . Right Shoulder - Pain, Follow-up    HPI 42 year old female returns she is been through multiple physical therapy visits and still is having persistent symptoms with her shoulder.  Original injury was more than 3 years ago when she was reaching back behind her low when felt some sharp pain in her shoulder.  She is been worked up with an arthrogram MRI done at  [facility] which showed some anterior fraying and small Hill-Sachs lesion.  She has some had some rotator cuff tendinopathy without full-thickness tear.  She has been through multiple physical therapy visits and has maybe had slight improvement but still has persistent symptoms  with external rotation.  She is had some brachial plexus tenderness persistent on her exam and had an MRI scan in 2007 cervical spine which was normal.  Plain radiographs of her neck including oblique views were normal.  She denies any numbness or tingling in her hands.  She denies pain with rotation of her neck.  Shoulder x-rays demonstrated some inferior anterior glenoid spurring which was also present on her MRI scan.  Review of Systems 14 point review of systems is updated unchanged from 02/08/2017 office visit other than as mentioned in HPI.   Objective: Vital Signs: BP 107/75   Pulse 68   Ht 5\' 5"  (1.651 m)   Wt 178 lb (80.7 kg)   LMP 02/24/2015   BMI 29.62 kg/m   Physical Exam  Constitutional: She is oriented to person, place, and time. She appears well-developed.  HENT:  Head: Normocephalic.  Right Ear: External ear normal.  Left Ear: External ear normal.  Eyes: Pupils are equal, round, and reactive to light.  Neck: No tracheal deviation present. No thyromegaly present.  Cardiovascular: Normal rate.  Pulmonary/Chest: Effort normal.  Abdominal: Soft.  Neurological: She is alert and oriented to person, place, and time.  Skin: Skin is warm and dry.  Psychiatric: She has a normal mood and affect. Her behavior is normal.    Ortho Exam patient has brachial plexus tenderness on  the right negative on the left.  Upper extremity reflexes are 2+ and symmetrical.  She still has discomfort with external rotation of her shoulder with apprehension which is reproducible with distraction.  No thenar or hyperthenar atrophy no biceps atrophy good biceps triceps strength.  She has some tenderness anteriorly over the shoulder.  Specialty Comments:  No specialty comments available.  Imaging: No results found.   PMFS History: Patient Active Problem List   Diagnosis Date Noted  . Fibroids 03/18/2015  . Menorrhagia 03/18/2015  . S/P hysterectomy 03/18/2015   Past Medical History:    Diagnosis Date  . GERD (gastroesophageal reflux disease)   . IBS (irritable bowel syndrome)   . IC (interstitial cystitis)     Family History  Problem Relation Age of Onset  . Hypertension Father   . Hypertension Mother     Past Surgical History:  Procedure Laterality Date  . LIPOMA EXCISION    . ROBOTIC ASSISTED TOTAL HYSTERECTOMY WITH BILATERAL SALPINGO OOPHERECTOMY Bilateral 03/18/2015  . ROBOTIC ASSISTED TOTAL HYSTERECTOMY WITH SALPINGECTOMY Bilateral 03/18/2015   Procedure: ROBOTIC ASSISTED TOTAL HYSTERECTOMY WITH BILATERAL SALPINGO OOPHORECTOMY;  Surgeon: Christophe Louis, MD;  Location: [facility];  Service: Gynecology;  Laterality: Bilateral;   Social History   Occupational History  . Occupation: Sales  Tobacco Use  . Smoking status: Never Smoker  . Smokeless tobacco: Never Used  Substance and Sexual Activity  . Alcohol use: Yes    Comment: socially  . Drug use: No  . Sexual activity: Not on file

## 2017-04-20 ENCOUNTER — Ambulatory Visit (INDEPENDENT_AMBULATORY_CARE_PROVIDER_SITE_OTHER): Payer: BLUE CROSS/BLUE SHIELD | Admitting: Orthopedic Surgery

## 2017-04-20 ENCOUNTER — Encounter (INDEPENDENT_AMBULATORY_CARE_PROVIDER_SITE_OTHER): Payer: Self-pay | Admitting: Orthopedic Surgery

## 2017-04-20 DIAGNOSIS — M7501 Adhesive capsulitis of right shoulder: Secondary | ICD-10-CM | POA: Diagnosis not present

## 2017-04-22 ENCOUNTER — Encounter (INDEPENDENT_AMBULATORY_CARE_PROVIDER_SITE_OTHER): Payer: Self-pay | Admitting: Orthopedic Surgery

## 2017-04-22 NOTE — Progress Notes (Signed)
Office Visit Note   Patient: Megan Wolf           Date of Birth: 02-May-1975           MRN: [id number] Visit Date: 04/20/2017 Requested by: Jonathon Jordan, MD [place], [place] [address] PCP: Jonathon Jordan, MD  Subjective: Chief Complaint  Patient presents with  . Right Shoulder - Pain    HPI: Patient presents for evaluation of right shoulder pain.  Has had ongoing pain for several years.  States that the pain will wake her from sleep at night.  She has had a previous subacromial injection by Dr. Lorin Mercy which did not help.  Physical therapy also did not help.  She reports constant pain and limitation of motion.  He has an MRI scan performed at [facility] which shows some anterior inferior labral abnormality but rotator cuff is intact.  He states it is hard for her to open and close doors.  She reports pain with sleeping.  Denies ever having any type of dislocation in the shoulder.              ROS: All systems reviewed are negative as they relate to the chief complaint within the history of present illness.  Patient denies  fevers or chills.   Assessment & Plan: Visit Diagnoses:  1. Adhesive capsulitis of right shoulder     Plan: Pression is right frozen shoulder with limitation of passive motion.  She does have a little bit of thickening of the inferior axillary recess on the MRI scan.  Discussed with her at length the natural history treatment of frozen shoulder.  Plan is intra-articular shoulder injection with 6-week return.  Could consider repeat injection.  I do want her to continue with a home exercise program of stretching and I provided his exercises for her.  No surgical indication at this time.  Follow-Up Instructions: Return in about 6 weeks (around 06/01/2017).   Orders:  No orders of the defined types were placed in this encounter.  No orders of the defined types were placed in this encounter.     Procedures: No procedures  performed   Clinical Data: No additional findings.  Objective: Vital Signs: LMP 02/24/2015   Physical Exam:   Constitutional: Patient appears well-developed HEENT:  Head: Normocephalic Eyes:EOM are normal Neck: Normal range of motion Cardiovascular: Normal rate Pulmonary/chest: Effort normal Neurologic: Patient is alert Skin: Skin is warm Psychiatric: Patient has normal mood and affect    Ortho Exam: Orthopedic exam demonstrates good cervical spine range of motion.  Negative apprehension relocation testing on the right.  No AC joint tenderness on the right.  Rotator cuff strength is intact.  She does have limited forward flexion abduction and external rotation right versus left consistent with adhesive capsulitis.  Specialty Comments:  No specialty comments available.  Imaging: No results found.   PMFS History: Patient Active Problem List   Diagnosis Date Noted  . Fibroids 03/18/2015  . Menorrhagia 03/18/2015  . S/P hysterectomy 03/18/2015   Past Medical History:  Diagnosis Date  . GERD (gastroesophageal reflux disease)   . IBS (irritable bowel syndrome)   . IC (interstitial cystitis)     Family History  Problem Relation Age of Onset  . Hypertension Father   . Hypertension Mother     Past Surgical History:  Procedure Laterality Date  . LIPOMA EXCISION    . ROBOTIC ASSISTED TOTAL HYSTERECTOMY WITH BILATERAL SALPINGO OOPHERECTOMY Bilateral 03/18/2015  .  ROBOTIC ASSISTED TOTAL HYSTERECTOMY WITH SALPINGECTOMY Bilateral 03/18/2015   Procedure: ROBOTIC ASSISTED TOTAL HYSTERECTOMY WITH BILATERAL SALPINGO OOPHORECTOMY;  Surgeon: Christophe Louis, MD;  Location: [facility];  Service: Gynecology;  Laterality: Bilateral;   Social History   Occupational History  . Occupation: Sales  Tobacco Use  . Smoking status: Never Smoker  . Smokeless tobacco: Never Used  Substance and Sexual Activity  . Alcohol use: Yes    Comment: socially  . Drug use: No  . Sexual activity: Not on  file

## 2017-06-02 ENCOUNTER — Ambulatory Visit (INDEPENDENT_AMBULATORY_CARE_PROVIDER_SITE_OTHER): Payer: BLUE CROSS/BLUE SHIELD | Admitting: Orthopedic Surgery

## 2017-06-28 ENCOUNTER — Ambulatory Visit (INDEPENDENT_AMBULATORY_CARE_PROVIDER_SITE_OTHER): Payer: BLUE CROSS/BLUE SHIELD | Admitting: Orthopedic Surgery

## 2017-07-20 ENCOUNTER — Other Ambulatory Visit (HOSPITAL_COMMUNITY): Payer: Self-pay | Admitting: Endocrinology

## 2017-07-20 DIAGNOSIS — E0590 Thyrotoxicosis, unspecified without thyrotoxic crisis or storm: Secondary | ICD-10-CM

## 2017-08-02 ENCOUNTER — Ambulatory Visit (HOSPITAL_COMMUNITY): Payer: BLUE CROSS/BLUE SHIELD

## 2017-08-02 ENCOUNTER — Other Ambulatory Visit (HOSPITAL_COMMUNITY): Payer: BLUE CROSS/BLUE SHIELD

## 2017-08-03 ENCOUNTER — Other Ambulatory Visit (HOSPITAL_COMMUNITY): Payer: BLUE CROSS/BLUE SHIELD

## 2017-09-21 ENCOUNTER — Other Ambulatory Visit (HOSPITAL_COMMUNITY): Payer: Self-pay | Admitting: Endocrinology

## 2017-09-21 DIAGNOSIS — E0590 Thyrotoxicosis, unspecified without thyrotoxic crisis or storm: Secondary | ICD-10-CM

## 2017-10-03 ENCOUNTER — Encounter (HOSPITAL_COMMUNITY)
Admission: RE | Admit: 2017-10-03 | Discharge: 2017-10-03 | Disposition: A | Discharge status: Home / Self Care | Payer: BLUE CROSS/BLUE SHIELD | Source: Ambulatory Visit | Attending: Endocrinology | Admitting: Endocrinology

## 2017-10-03 DIAGNOSIS — E0590 Thyrotoxicosis, unspecified without thyrotoxic crisis or storm: Secondary | ICD-10-CM | POA: Diagnosis present

## 2017-10-03 MED ORDER — SODIUM IODIDE I-123 7.4 MBQ CAPS
293.6000 | ORAL_CAPSULE | Freq: Once | ORAL | Status: AC
  Administered 2017-10-03: 293.6 via ORAL

## 2017-10-04 ENCOUNTER — Encounter (HOSPITAL_COMMUNITY)
Admission: RE | Admit: 2017-10-04 | Discharge: 2017-10-04 | Disposition: A | Discharge status: Home / Self Care | Payer: BLUE CROSS/BLUE SHIELD | Source: Ambulatory Visit | Attending: Endocrinology | Admitting: Endocrinology

## 2017-10-04 DIAGNOSIS — E0590 Thyrotoxicosis, unspecified without thyrotoxic crisis or storm: Secondary | ICD-10-CM | POA: Insufficient documentation

## 2017-10-18 ENCOUNTER — Other Ambulatory Visit (HOSPITAL_COMMUNITY): Payer: Self-pay | Admitting: Endocrinology

## 2017-10-18 ENCOUNTER — Other Ambulatory Visit: Payer: Self-pay | Admitting: Endocrinology

## 2017-10-18 DIAGNOSIS — E0500 Thyrotoxicosis with diffuse goiter without thyrotoxic crisis or storm: Secondary | ICD-10-CM

## 2017-11-02 ENCOUNTER — Inpatient Hospital Stay (HOSPITAL_COMMUNITY): Admission: RE | Admit: 2017-11-02 | Payer: BLUE CROSS/BLUE SHIELD | Source: Ambulatory Visit

## 2017-12-14 DIAGNOSIS — E0500 Thyrotoxicosis with diffuse goiter without thyrotoxic crisis or storm: Secondary | ICD-10-CM | POA: Diagnosis not present

## 2017-12-14 DIAGNOSIS — E0590 Thyrotoxicosis, unspecified without thyrotoxic crisis or storm: Secondary | ICD-10-CM | POA: Diagnosis not present

## 2018-04-03 DIAGNOSIS — E0500 Thyrotoxicosis with diffuse goiter without thyrotoxic crisis or storm: Secondary | ICD-10-CM | POA: Diagnosis not present

## 2018-04-03 DIAGNOSIS — R202 Paresthesia of skin: Secondary | ICD-10-CM | POA: Diagnosis not present

## 2018-04-13 ENCOUNTER — Ambulatory Visit (HOSPITAL_COMMUNITY)
Admission: RE | Admit: 2018-04-13 | Discharge: 2018-04-13 | Disposition: A | Discharge status: Home / Self Care | Payer: BLUE CROSS/BLUE SHIELD | Source: Ambulatory Visit | Attending: Endocrinology | Admitting: Endocrinology

## 2018-04-13 DIAGNOSIS — E0500 Thyrotoxicosis with diffuse goiter without thyrotoxic crisis or storm: Secondary | ICD-10-CM | POA: Insufficient documentation

## 2018-04-13 DIAGNOSIS — E0590 Thyrotoxicosis, unspecified without thyrotoxic crisis or storm: Secondary | ICD-10-CM | POA: Diagnosis not present

## 2018-04-13 MED ORDER — SODIUM IODIDE I 131 CAPSULE
15.5000 | Freq: Once | INTRAVENOUS | Status: AC | PRN
  Administered 2018-04-13: 15.5 via ORAL

## 2020-06-07 ENCOUNTER — Other Ambulatory Visit: Payer: Self-pay

## 2020-06-07 ENCOUNTER — Emergency Department (HOSPITAL_BASED_OUTPATIENT_CLINIC_OR_DEPARTMENT_OTHER)
Admission: EM | Admit: 2020-06-07 | Discharge: 2020-06-07 | Disposition: A | Discharge status: Home / Self Care | Payer: Commercial Managed Care - PPO | Attending: Emergency Medicine | Admitting: Emergency Medicine

## 2020-06-07 DIAGNOSIS — S61012A Laceration without foreign body of left thumb without damage to nail, initial encounter: Secondary | ICD-10-CM | POA: Diagnosis not present

## 2020-06-07 DIAGNOSIS — Z23 Encounter for immunization: Secondary | ICD-10-CM | POA: Insufficient documentation

## 2020-06-07 DIAGNOSIS — W260XXA Contact with knife, initial encounter: Secondary | ICD-10-CM | POA: Insufficient documentation

## 2020-06-07 DIAGNOSIS — Y93G1 Activity, food preparation and clean up: Secondary | ICD-10-CM | POA: Insufficient documentation

## 2020-06-07 DIAGNOSIS — Z91040 Latex allergy status: Secondary | ICD-10-CM | POA: Insufficient documentation

## 2020-06-07 DIAGNOSIS — S6992XA Unspecified injury of left wrist, hand and finger(s), initial encounter: Secondary | ICD-10-CM | POA: Diagnosis present

## 2020-06-07 MED ORDER — TETANUS-DIPHTH-ACELL PERTUSSIS 5-2.5-18.5 LF-MCG/0.5 IM SUSY
0.5000 mL | PREFILLED_SYRINGE | Freq: Once | INTRAMUSCULAR | Status: AC
  Administered 2020-06-07: 0.5 mL via INTRAMUSCULAR
  Filled 2020-06-07: qty 0.5

## 2020-06-07 MED ORDER — LIDOCAINE HCL (PF) 1 % IJ SOLN
5.0000 mL | Freq: Once | INTRAMUSCULAR | Status: AC
Start: 2020-06-07 — End: 2020-06-07

## 2020-06-07 MED ORDER — LIDOCAINE HCL (PF) 1 % IJ SOLN
INTRAMUSCULAR | Status: AC
  Administered 2020-06-07: 5 mL
  Filled 2020-06-07: qty 5

## 2020-06-07 NOTE — Discharge Instructions (Addendum)
Keep the wound dry for 2 days.  Keep it covered as well.  After 2 days you may wash gently with soap and water but do not do prolonged soaking in water.  Return in 7 to 10 days for suture removal.  Return immediately if you see signs of infection such as increased redness increased pain purulent discharge or any additional concerns.

## 2020-06-07 NOTE — ED Provider Notes (Signed)
[name] DEPT Provider Note   CSN: [id number] Arrival date & time: 06/07/20  2025     History Chief Complaint  Patient presents with  . Laceration    Left thumb     Megan Wolf is a 45 y.o. female.  Patient presents with left thumb laceration.  She states that she was cutting lettuce about 10 hours prior to arrival when she cut her left thumb.  She immediately wrapped it and went about her day.  When she unwrapped it a few hours ago was still bleeding so she presents to the ER.  Last tetanus is unknown.        Past Medical History:  Diagnosis Date  . GERD (gastroesophageal reflux disease)   . IBS (irritable bowel syndrome)   . IC (interstitial cystitis)     Patient Active Problem List   Diagnosis Date Noted  . Fibroids 03/18/2015  . Menorrhagia 03/18/2015  . S/P hysterectomy 03/18/2015    Past Surgical History:  Procedure Laterality Date  . LIPOMA EXCISION    . ROBOTIC ASSISTED TOTAL HYSTERECTOMY WITH BILATERAL SALPINGO OOPHERECTOMY Bilateral 03/18/2015  . ROBOTIC ASSISTED TOTAL HYSTERECTOMY WITH SALPINGECTOMY Bilateral 03/18/2015   Procedure: ROBOTIC ASSISTED TOTAL HYSTERECTOMY WITH BILATERAL SALPINGO OOPHORECTOMY;  Surgeon: Christophe Louis, MD;  Location: [facility];  Service: Gynecology;  Laterality: Bilateral;     OB History   No obstetric history on file.     Family History  Problem Relation Age of Onset  . Hypertension Father   . Hypertension Mother     Social History   Tobacco Use  . Smoking status: Never Smoker  . Smokeless tobacco: Never Used  Substance Use Topics  . Alcohol use: Yes    Comment: socially  . Drug use: No    Home Medications Prior to Admission medications   Medication Sig Start Date End Date Taking? Authorizing Provider  hydrOXYzine (ATARAX/VISTARIL) 25 MG tablet Take 25-50 mg by mouth at bedtime.    [provider]  ibuprofen (ADVIL,MOTRIN) 800 MG tablet Take 1 tablet (800 mg total) by  mouth every 8 (eight) hours as needed for moderate pain (mild pain). 03/22/15   Christophe Louis, MD  Multiple Vitamin (MULTIVITAMIN WITH MINERALS) TABS tablet Take 1 tablet by mouth daily.    [provider]  polyethylene glycol (MIRALAX / GLYCOLAX) packet Take 17 g by mouth daily.    [provider]    Allergies    Latex  Review of Systems   Review of Systems  Constitutional: Negative for fever.  HENT: Negative for ear pain.   Eyes: Negative for pain.  Respiratory: Negative for cough.   Cardiovascular: Negative for chest pain.  Gastrointestinal: Negative for abdominal pain.  Genitourinary: Negative for flank pain.  Musculoskeletal: Negative for back pain.  Skin: Negative for rash.  Neurological: Negative for headaches.    Physical Exam Updated Vital Signs BP 130/72   Pulse 80   Temp 98.2 F (36.8 C) (Oral)   Resp 18   LMP 02/24/2015   SpO2 100%   Physical Exam Constitutional:      General: She is not in acute distress.    Appearance: Normal appearance.  HENT:     Head: Normocephalic.     Nose: Nose normal.  Eyes:     Extraocular Movements: Extraocular movements intact.  Cardiovascular:     Rate and Rhythm: Normal rate.  Pulmonary:     Effort: Pulmonary effort is normal.  Musculoskeletal:  General: Normal range of motion.     Cervical back: Normal range of motion.  Skin:    Comments: 2.75 curvilinear laceration on the radial aspect of the left thumb distal tip.  No nail involvement.  Thumb is neurovascular intact interphalangeal joint as well as MCP joint.  Neurological:     General: No focal deficit present.     Mental Status: She is alert. Mental status is at baseline.     ED Results / Procedures / Treatments   Labs (all labs ordered are listed, but only abnormal results are displayed) Labs Reviewed - No data to display  EKG None  Radiology No results found.  Procedures .Marland KitchenLaceration Repair  Date/Time: 06/07/2020 9:02  PM Performed by: Luna Fuse, MD Authorized by: Luna Fuse, MD   Comments:     Left hand thumb 2.75 cm laceration repaired with two 4-0 Ethilon sutures placed.  Wound was anesthetized lidocaine 1% total of 1 cc instilled.  Washed out thoroughly with sterile water under pressure.  2 sutures placed with good approximation of edges.  Patient tolerated procedure well.  Neuro vas intact after placement.  Dressing placed no active bleeding noted.  Instructions given for return precautions.     Medications Ordered in ED Medications  Tdap (BOOSTRIX) injection 0.5 mL (has no administration in time range)  lidocaine (PF) (XYLOCAINE) 1 % injection 5 mL (5 mLs Infiltration Given 06/07/20 2058)    ED Course  I have reviewed the triage vital signs and the nursing notes.  Pertinent labs & imaging results that were available during my care of the patient were reviewed by me and considered in my medical decision making (see chart for details).    MDM Rules/Calculators/A&P                          Wound was anesthetized with lidocaine and two 4-0 sutures were placed.  Advised return in 7 to 10 days for suture removal.   Final Clinical Impression(s) / ED Diagnoses Final diagnoses:  Laceration of left thumb, foreign body presence unspecified, nail damage status unspecified, initial encounter    Rx / DC Orders ED Discharge Orders    None       Luna Fuse, MD 06/07/20 2102

## 2020-06-07 NOTE — ED Triage Notes (Signed)
Pt to ED from Home POV with c/o laceration to left thumb x 10 hours ago when pt was cutting cabbage serrated kitchen knife. Stable bleeding.

## 2022-05-24 DIAGNOSIS — Z79899 Other long term (current) drug therapy: Secondary | ICD-10-CM | POA: Diagnosis not present

## 2022-05-24 DIAGNOSIS — R7303 Prediabetes: Secondary | ICD-10-CM | POA: Diagnosis not present

## 2022-05-24 DIAGNOSIS — Z13220 Encounter for screening for lipoid disorders: Secondary | ICD-10-CM | POA: Diagnosis not present

## 2022-05-24 DIAGNOSIS — Z23 Encounter for immunization: Secondary | ICD-10-CM | POA: Diagnosis not present

## 2022-05-24 DIAGNOSIS — Z0000 Encounter for general adult medical examination without abnormal findings: Secondary | ICD-10-CM | POA: Diagnosis not present

## 2022-06-06 DIAGNOSIS — E890 Postprocedural hypothyroidism: Secondary | ICD-10-CM | POA: Diagnosis not present

## 2022-06-06 DIAGNOSIS — E669 Obesity, unspecified: Secondary | ICD-10-CM | POA: Diagnosis not present

## 2022-06-06 DIAGNOSIS — R7303 Prediabetes: Secondary | ICD-10-CM | POA: Diagnosis not present

## 2022-06-13 DIAGNOSIS — R7303 Prediabetes: Secondary | ICD-10-CM | POA: Diagnosis not present

## 2022-06-13 DIAGNOSIS — Z6833 Body mass index (BMI) 33.0-33.9, adult: Secondary | ICD-10-CM | POA: Diagnosis not present

## 2022-06-13 DIAGNOSIS — E890 Postprocedural hypothyroidism: Secondary | ICD-10-CM | POA: Diagnosis not present

## 2023-06-20 DIAGNOSIS — Z0000 Encounter for general adult medical examination without abnormal findings: Secondary | ICD-10-CM | POA: Diagnosis not present

## 2023-06-20 DIAGNOSIS — Z79899 Other long term (current) drug therapy: Secondary | ICD-10-CM | POA: Diagnosis not present

## 2023-08-16 DIAGNOSIS — E890 Postprocedural hypothyroidism: Secondary | ICD-10-CM | POA: Diagnosis not present

## 2023-08-17 DIAGNOSIS — N912 Amenorrhea, unspecified: Secondary | ICD-10-CM | POA: Diagnosis not present

## 2023-08-17 DIAGNOSIS — E88811 Insulin resistance syndrome, type a: Secondary | ICD-10-CM | POA: Diagnosis not present

## 2023-08-17 DIAGNOSIS — E669 Obesity, unspecified: Secondary | ICD-10-CM | POA: Diagnosis not present

## 2023-08-17 DIAGNOSIS — E890 Postprocedural hypothyroidism: Secondary | ICD-10-CM | POA: Diagnosis not present
